# Patient Record
Sex: Female | Born: 1975 | Race: Black or African American | Hispanic: No | Marital: Single | Smoking: Never smoker
Health system: Southern US, Community
[De-identification: ages and names within clinical notes are randomized; demographics above are authoritative.]

## PROBLEM LIST (undated history)

## (undated) HISTORY — PX: ABDOMINAL SURGERY: SHX537

---

## 1998-04-14 ENCOUNTER — Emergency Department (HOSPITAL_COMMUNITY): Admission: EM | Admit: 1998-04-14 | Discharge: 1998-04-14 | Payer: Self-pay | Admitting: Emergency Medicine

## 1998-07-23 ENCOUNTER — Ambulatory Visit (HOSPITAL_COMMUNITY): Admission: RE | Admit: 1998-07-23 | Discharge: 1998-07-23 | Payer: Self-pay | Admitting: Obstetrics & Gynecology

## 1998-12-16 ENCOUNTER — Inpatient Hospital Stay (HOSPITAL_COMMUNITY): Admission: AD | Admit: 1998-12-16 | Discharge: 1998-12-16 | Payer: Self-pay | Admitting: *Deleted

## 1998-12-19 ENCOUNTER — Inpatient Hospital Stay (HOSPITAL_COMMUNITY): Admission: AD | Admit: 1998-12-19 | Discharge: 1998-12-21 | Payer: Self-pay | Admitting: Obstetrics

## 1998-12-19 ENCOUNTER — Observation Stay (HOSPITAL_COMMUNITY): Admission: AD | Admit: 1998-12-19 | Discharge: 1998-12-19 | Payer: Self-pay | Admitting: *Deleted

## 1999-12-17 ENCOUNTER — Emergency Department (HOSPITAL_COMMUNITY): Admission: EM | Admit: 1999-12-17 | Discharge: 1999-12-17 | Payer: Self-pay | Admitting: Emergency Medicine

## 2001-01-31 ENCOUNTER — Inpatient Hospital Stay (HOSPITAL_COMMUNITY): Admission: AD | Admit: 2001-01-31 | Discharge: 2001-01-31 | Payer: Self-pay | Admitting: *Deleted

## 2001-02-06 ENCOUNTER — Other Ambulatory Visit: Admission: RE | Admit: 2001-02-06 | Discharge: 2001-02-06 | Payer: Self-pay | Admitting: Obstetrics

## 2001-02-16 ENCOUNTER — Inpatient Hospital Stay (HOSPITAL_COMMUNITY): Admission: AD | Admit: 2001-02-16 | Discharge: 2001-02-16 | Payer: Self-pay | Admitting: Obstetrics & Gynecology

## 2001-04-19 ENCOUNTER — Ambulatory Visit (HOSPITAL_COMMUNITY): Admission: RE | Admit: 2001-04-19 | Discharge: 2001-04-19 | Payer: Self-pay | Admitting: *Deleted

## 2001-04-26 ENCOUNTER — Emergency Department (HOSPITAL_COMMUNITY): Admission: EM | Admit: 2001-04-26 | Discharge: 2001-04-26 | Payer: Self-pay | Admitting: Emergency Medicine

## 2001-04-26 ENCOUNTER — Encounter: Payer: Self-pay | Admitting: Emergency Medicine

## 2001-06-26 ENCOUNTER — Inpatient Hospital Stay (HOSPITAL_COMMUNITY): Admission: AD | Admit: 2001-06-26 | Discharge: 2001-06-26 | Payer: Self-pay | Admitting: Obstetrics

## 2001-07-17 ENCOUNTER — Ambulatory Visit (HOSPITAL_COMMUNITY): Admission: RE | Admit: 2001-07-17 | Discharge: 2001-07-17 | Payer: Self-pay | Admitting: *Deleted

## 2001-09-01 ENCOUNTER — Encounter (HOSPITAL_COMMUNITY): Admission: RE | Admit: 2001-09-01 | Discharge: 2001-09-01 | Payer: Self-pay | Admitting: *Deleted

## 2001-09-03 ENCOUNTER — Inpatient Hospital Stay (HOSPITAL_COMMUNITY): Admission: AD | Admit: 2001-09-03 | Discharge: 2001-09-06 | Payer: Self-pay | Admitting: *Deleted

## 2001-09-03 ENCOUNTER — Encounter (INDEPENDENT_AMBULATORY_CARE_PROVIDER_SITE_OTHER): Payer: Self-pay | Admitting: Specialist

## 2001-09-08 ENCOUNTER — Inpatient Hospital Stay (HOSPITAL_COMMUNITY): Admission: AD | Admit: 2001-09-08 | Discharge: 2001-09-12 | Payer: Self-pay | Admitting: Obstetrics & Gynecology

## 2001-10-26 ENCOUNTER — Encounter: Admission: RE | Admit: 2001-10-26 | Discharge: 2001-11-10 | Payer: Self-pay | Admitting: Neurology

## 2002-05-30 ENCOUNTER — Emergency Department (HOSPITAL_COMMUNITY): Admission: EM | Admit: 2002-05-30 | Discharge: 2002-05-30 | Payer: Self-pay | Admitting: Emergency Medicine

## 2003-03-30 ENCOUNTER — Emergency Department (HOSPITAL_COMMUNITY): Admission: EM | Admit: 2003-03-30 | Discharge: 2003-03-30 | Payer: Self-pay | Admitting: Emergency Medicine

## 2005-03-18 ENCOUNTER — Emergency Department (HOSPITAL_COMMUNITY): Admission: EM | Admit: 2005-03-18 | Discharge: 2005-03-18 | Payer: Self-pay | Admitting: Emergency Medicine

## 2005-07-21 ENCOUNTER — Emergency Department (HOSPITAL_COMMUNITY): Admission: EM | Admit: 2005-07-21 | Discharge: 2005-07-21 | Payer: Self-pay | Admitting: Emergency Medicine

## 2007-03-08 ENCOUNTER — Encounter: Admission: RE | Admit: 2007-03-08 | Discharge: 2007-03-08 | Payer: Self-pay | Admitting: Internal Medicine

## 2007-04-14 ENCOUNTER — Encounter: Admission: RE | Admit: 2007-04-14 | Discharge: 2007-05-17 | Payer: Self-pay | Admitting: Internal Medicine

## 2007-11-20 ENCOUNTER — Encounter: Admission: RE | Admit: 2007-11-20 | Discharge: 2007-11-20 | Payer: Self-pay | Admitting: Occupational Medicine

## 2008-01-11 ENCOUNTER — Inpatient Hospital Stay (HOSPITAL_COMMUNITY): Admission: AD | Admit: 2008-01-11 | Discharge: 2008-01-13 | Payer: Self-pay | Admitting: Obstetrics & Gynecology

## 2008-02-11 ENCOUNTER — Inpatient Hospital Stay (HOSPITAL_COMMUNITY): Admission: AD | Admit: 2008-02-11 | Discharge: 2008-02-11 | Payer: Self-pay | Admitting: Obstetrics

## 2008-03-23 ENCOUNTER — Inpatient Hospital Stay (HOSPITAL_COMMUNITY): Admission: AD | Admit: 2008-03-23 | Discharge: 2008-03-23 | Payer: Self-pay | Admitting: Obstetrics & Gynecology

## 2008-03-28 ENCOUNTER — Encounter (INDEPENDENT_AMBULATORY_CARE_PROVIDER_SITE_OTHER): Payer: Self-pay | Admitting: Obstetrics & Gynecology

## 2008-03-28 ENCOUNTER — Inpatient Hospital Stay (HOSPITAL_COMMUNITY): Admission: AD | Admit: 2008-03-28 | Discharge: 2008-03-30 | Payer: Self-pay | Admitting: Obstetrics and Gynecology

## 2009-09-01 ENCOUNTER — Emergency Department (HOSPITAL_COMMUNITY): Admission: EM | Admit: 2009-09-01 | Discharge: 2009-09-01 | Payer: Self-pay | Admitting: Emergency Medicine

## 2011-02-23 ENCOUNTER — Other Ambulatory Visit: Payer: Self-pay | Admitting: Obstetrics

## 2011-05-04 NOTE — Op Note (Signed)
[name], Allison Klein              ACCOUNT NO.:  [id number]   MEDICAL RECORD NO.:  [id number]          PATIENT TYPE:  INP   LOCATION:  9120                          FACILITY:  [facility]   PHYSICIAN:  Genia Del, M.D.DATE OF BIRTH:  11-Jul-1976   DATE OF PROCEDURE:  03/28/2008  DATE OF DISCHARGE:                               OPERATIVE REPORT   PREOPERATIVE DIAGNOSIS:  Desire for postpartum bilateral tubal  sterilization.   POSTOPERATIVE DIAGNOSIS:  Desire for postpartum bilateral tubal  sterilization.   INTERVENTION:  Postpartum bilateral tubal sterilization by modified  Pomeroy technique by mini-laparotomy   SURGEON:  Genia Del, M.D.   ANESTHESIOLOGIST:  Raul Del, M.D.   PROCEDURE:  Under spinal anesthesia, the patient is in dorsal decubitus  position, she is prepped with Betadine on the abdominal and suprapubic  areas and draped as usual.  We make an infraumbilical incision over 2 cm  with the scalpel.  We open the aponeurosis transversely with Mayo  scissors and open the parietoperitoneum bluntly with a finger.  We then  use Navy retractors to reach the left tube and the left tube is grasped  with two Babcocks, the distal end of the tube is identified with the  fimbriae.  We then make a window in the mesosalpinx with the  electrocautery.  We use plain suture to ligate the tube proximally at  about 2 cm from the cornua and then distally about 1 cm from the first  suture.  We then cut with Metzenbaum scissors a portion of the tube  between the two sutures and send it to pathology.  We cauterize each cut  extremity of the tube.  Hemostasis is adequate.  We proceed exactly the  same way on the right tube and hemostasis is adequate at all levels.  We  remove the retractors. We closed the aponeurosis with a 0 Vicryl running  suture.  We then complete hemostasis on the adipose tissue with the  electrocautery and close the skin with a subcuticular stitch of 4-0  Vicryl.  Dermabond is then applied on the skin.  The count of  instruments and sponges was complete. The estimated blood loss was  minimal.  No complications occurred.  The patient was brought to the  recovery room in good stable status.      Genia Del, M.D.  Electronically Signed     ML/MEDQ  D:  03/28/2008  T:  03/28/2008  Job:  [id number]

## 2011-05-07 NOTE — Discharge Summary (Signed)
[facility] of [facility]  Patient:    Allison Klein, Allison Klein Visit Number: [id number] MRN: [id number]          Service Type: ANT Location: [facility] Attending Physician:  Michaelle Copas Dictated by:   Bradly Bienenstock, M.D. Admit Date:  09/01/2001 Discharge Date: 09/01/2001                             Discharge Summary  DISCHARGE DIAGNOSES:          1. Intrauterine pregnancy at                                  40 weeks--delivered.                               2. Status post low transverse cesarean section.                               3. L5 radiculopathy versus right peroneal                                  neuropathy.  CONSULTS WHILE IN HOSPITAL:   Dr. Orlin Hilding, neurology.  PROCEDURES WHILE IN HOSPITAL: Low transverse cesarean section. Estimated blood                               loss 800 cc.  DISCHARGE MEDICATIONS:        1. Motrin 800 mg t.i.d. scheduled for five days                                  then p.r.n.                               2. Percocet one to two tabs q.6h. p.r.n. pain.                               3. Prenatal vitamins.  DISCHARGE FOLLOWUP:           The patient is to follow up with (1) [facility] in six weeks, and (2) with Dr. Orlin Hilding, [facility], if symptoms of her radiculopathy do not improve within three weeks.  HOSPITAL COURSE:              This is a 35 year old, G2, P1-0-0-1, who presented at 40 weeks 2 days who was dated by a last menstrual period and second trimester ultrasound, who presented in active labor without membrane rupture. The patient had a history of one prior normal spontaneous vaginal delivery of a 7-pound 1-ounce female and had not had any complications. The patient did, however, have some preterm labor with this pregnancy which was believed secondary to bacterial vaginosis. At any rate, she presented in active labor and was admitted for management of her labor. The patient progressed in labor until  the evening of September 15 when the patient did not have any change in station or dilation. The patient was felt to be +2,  vertex, with complete dilation but thought to be in OP position and without further descent from +2 station. Therefore, it was decided to take the patient back for low transverse cesarean section which was performed by Dr. Clearance Coots under epidural anesthesia. For full details of that surgery, please see that dictation. The patient was delivered of a viable female, Apgars 8 and 9 and weighed 10 pounds 5 ounces. The patients anatomy appeared normal during the surgery.  The patient began routine postpartum care, however, on two days postpartum, she began complaining of right leg paresthesias and was found to have decreased strength in her right lower extremity, particularly with dorsiflexion. Secondary to this, neurology was consulted. They felt that she either had an L5 radiculopathy or a right peroneal neuropathy, but felt that she was okay to be discharged home and likely this would resolve on its own. However, if not, she was to follow with neurology in three weeks.  Hospital course was otherwise uncomplicated.  DISCHARGE LABORATORIES:       The patients discharge hemoglobin was 10.5.Dictated by:   Bradly Bienenstock, M.D. Attending Physician:  Michaelle Copas DD:  09/06/01 TD:  09/06/01 Job: [id number]

## 2011-05-07 NOTE — Consult Note (Signed)
[facility] of [facility]  Patient:    Allison Klein, Allison Klein Visit Number: [id number] MRN: [id number]          Service Type: ANT Location: [facility] Attending Physician:  Michaelle Copas Dictated by:   Gustavus Messing Orlin Hilding, M.D. Consultation Date: 09/07/19 Admit Date:  09/01/2001 Discharge Date: 09/01/2001                            Consultation Report  NEUROLOGY CONSULTATION  DATE OF ADMISSION:            09/04/2001.  CHIEF COMPLAINT:              Right leg numbness, weakness, and pain.  HISTORY OF PRESENT ILLNESS:   Allison Klein is a 35 year old woman, status post a C-section on September 15 for failure to descend. On recovering from the epidural, she complained of her right leg continuing to be weak and numb and numbness in the medial aspect from the knee down with mild discomfort and a foot drop. There is mild discomfort present.  REVIEW OF SYSTEMS:            Positive for some mild incisional pain preoperatively and prepregnancy, she had no right leg pain or numbness. She had had some posterior buttock sciatica-type pain at the end of her pregnancy.  PAST MEDICAL HISTORY:         Significant for obesity, remote Chlamydia, previous childbirth, but otherwise healthy.  MEDICATIONS:                  She is just on a lot of p.r.n. medications which include Peri-Colace, Mylicon, Tylenol, Percocet, Ibuprofen, Darvocet, morphine, Dilaudid, Phenergan, Benadryl and Ambien.  ALLERGIES:                    No known drug allergies.  SOCIAL HISTORY:               No cigarette and alcohol use.  FAMILY HISTORY:               Noncontributory.  PHYSICAL EXAMINATION:  VITAL SIGNS:                  Temperature is 97.9, pulse 90, respirations 20, blood pressure 118/54.  NEUROLOGIC/EXTREMITIES:       She has good grips bilaterally. Normal cranial nerves, upper extremities are unaffected. In lower extremities she has mild to moderate edema, right greater than left.  The left lower extremity shows normal 5/5 strength. Right lower extremity: She has got 5/5 psoas, 5-/5 quads, 5-/5 hamstring, 0-1/5 foot dorsiflexion with good toe flexion, 0-1/5 extensor hallucis longus, 3-4/5 inversion, 4/5 eversion. She has very brisk reflexes symmetrically in knee jerks and ankles, downgoing toes.  Subjectively, she has numbness medially below the knee. She ambulates with a mild right foot drop.  IMPRESSION:                   Right L5 radiculopathy versus right peroneal neuropathy. Some mixed findings on exam make it a little bit difficult to sort out.  RECOMMENDATION:               Create a discharge according to your plans. She may benefit from an ankle/foot orthotic. She should call our office at [phone] if her symptoms are not resolved in three weeks. We can then set up nerve conduction EMG to confirm neuropathy versus radiculopathy and give prognosis. Dictated by:  Catherine A. Orlin Hilding, M.D. Attending Physician:  Michaelle Copas DD:  09/06/01 TD:  09/06/01 Job: [phone]

## 2011-05-07 NOTE — Discharge Summary (Signed)
[facility] of [facility]  Patient:    Allison Klein, Allison Klein Visit Number: [id number] MRN: [id number]          Service Type: MED Location: 910A 9148 01 Attending Physician:  Antionette Char Dictated by:   Salvadore Dom, M.D. Admit Date:  09/08/2001 Discharge Date: 09/12/2001                             Discharge Summary  HISTORY OF PRESENT ILLNESS:   A 35 year old, postpartum day 5, from low C-section for failure descend and persistent OP for previous discharge.  She had a neuropathy noted at last hospitalization.  She was seen in [facility]U for follow-up for blood pressure check and staple removal.  While there, she fell, with resultant left sided pain.  Patient was also noted to have increased blood pressures with increased uric acid and LFTs.  Admitted for magnesium sulfate treatment of preeclampsia.  HOSPITAL COURSE:              She complained during her hospitalization of worsening frontal headache, but also had some nasal congestion.  She initially had some right upper quadrant tenderness, but this improved on hospital day #1.  Her magnesium was initially increased on day 1, secondary to being subtherapeutic.  Her LFTs resolved rapidly and her headache decreased.  On hospital day #3, her LFTs were in the normal range.  She was headache-free. Her uric acid and LDH continued to be mildly elevated, but were trending down. She was then discharged home with routine follow up.  DISCHARGE DIET:               Regular.  DISCHARGE ACTIVITY:           No heavy lifting for two weeks.  DISCHARGE FOLLOW-UP:          She is to be seen in maternity admissions two days post discharge for blood pressure check and then at her routine postpartum check.  DISCHARGE MEDICATIONS:        1. Motrin 800 mg p.o. t.i.d.                               2. Percocet as needed every four hours for pain.  DISCHARGE DIAGNOSES:          1. Mild preeclampsia.                               2.  Acute upper respiratory infection, resolved.                               3. Status post fall with resultant left sided                               pain, resolved. Dictated by:   Salvadore Dom, M.D. Attending Physician:  Antionette Char DD:  12/11/01 TD:  12/13/01 Job: [id number]

## 2011-05-07 NOTE — Op Note (Signed)
[facility] of [facility]  Patient:    Allison Klein, Allison Klein Visit Number: [id number] MRN: [id number]          Service Type: OBS Location: 910B 9164 01 Attending Physician:  Michaelle Copas Dictated by:   Bing Neighbors Clearance Coots, M.D. Proc. Date: 09/03/01 Admit Date:  09/03/2001                             Operative Report  PREOPERATIVE DIAGNOSIS:       Arrest of descent.  POSTOPERATIVE DIAGNOSIS:      Arrest of descent.  PROCEDURE:                    Primary low transverse cesarean section.  SURGEON:                      Charles A. Clearance Coots, M.D.  ASSISTANT:                    Marlinda Mike, C.N.M.  ANESTHESIA:                   Epidural.  COMPLICATIONS:                None.  DRAINS:                       Foley to gravity.  ESTIMATED BLOOD LOSS:         800 ml.  IV FLUIDS:                    2200 ml.  URINE OUT:                    200 ml clear.  FINDINGS:                     Viable female at 55.  Apgars 8 at one minute and 9 at five minutes.  Weight 10 lb 5 oz.  Normal uterus fallopian tubes and ovaries.  DESCRIPTION OF PROCEDURE:     The patient was brought to the operating room and, after satisfactory redosing of the epidural to a satisfactory level, the abdomen was prepped and draped in the usual sterile fashion.  A Pfannenstiel skin incision was made with a scalpel and was deepened down to the fascia with the scalpel.  The fascia was nicked in the midline.  The fascial incision was extended to the left and to the right with curved Mayo scissors.  The superior and inferior  fascial edges were taken off of the rectus muscle with both blunt and sharp dissection.  The rectus muscles were bluntly divided in the midline and the peritoneum was entered digitally and was digitally extended to the left and to the right.  A bladder blade was positioned and the vesicouterine fold of the peritoneum above the reflection of the urinary bladder was grasped with  forceps, was incision and undermined with Metzenbaum scissors.  The incision was extended to the left and to the right with Metzenbaum scissors.  A bladder flap was bluntly developed and the bladder blade was repositioned in front of the urinary bladder and placed well out of the operative field.  The uterus was entered in the lower uterine segment transversely with a scalpel.  A moderate amount of clear amniotic fluid was expelled.  The uterine incision was extended to the  left and to the right in a smile configuration with the bandage scissors.  The vertex was noted to be in the left occiput posterior position.  The occiput was rotated into the incision.  The delivery was accomplished with the aid of fundal pressure from the assistant.  The infants mouth and nose were suctioned with the suction bulb and delivery was completed with the aid of fundal pressure from the assistant.  The umbilical cord was doubly clamped and cut and the infant was handed to the nursery staff.  Cord blood was obtained and the placenta was spontaneously expelled from the uterine cavity intact.  The uterus was exteriorized and the endometrial surfaces thoroughly debrided with a dry lap sponge.  The edges of the uterine incision were grasped with ring forceps. The uterus was closed with continuous suture of interlocking suture of 0 Monocryl from each corner to the center.  Hemostasis was excellent.  The uterus was placed back in its normal anatomic position and the pelvic cavity was thoroughly irrigated with warm saline solution.  All clots were removed. Closure of the uterus was again observed for hemostasis, and there was no active bleeding noted.  The abdomen was then closed as follows.  The fascia was closed with a continuous suture of 0 PDS from each corner to the center. The subcutaneous tissue was thoroughly irrigated with warm saline solution. All areas of subcutaneous bleeding were coagulated with the  Bovie.  The skin was then approximated with surgical stainless steel staples.  Sterile bandage was applied to the incision closure.  The surgical technician indicated that all sponge, needle and instrument counts were correct.  The patient tolerated the procedure well and was transported to the recovery room in satisfactory condition. Dictated by:   Bing Neighbors Clearance Coots, M.D. Attending Physician:  Michaelle Copas DD:  09/03/01 TD:  09/03/01 Job: [id number]

## 2011-09-09 LAB — CBC
HCT: 35.2 — ABNORMAL LOW
Hemoglobin: 11.9 — ABNORMAL LOW
MCHC: 33.7
MCV: 89
RBC: 3.96

## 2011-09-09 LAB — URINE CULTURE
Colony Count: 60000
Special Requests: NEGATIVE

## 2011-09-10 LAB — URINALYSIS, ROUTINE W REFLEX MICROSCOPIC
Glucose, UA: NEGATIVE
Hgb urine dipstick: NEGATIVE
Ketones, ur: NEGATIVE
Protein, ur: NEGATIVE
pH: 6

## 2011-09-10 LAB — CBC
Platelets: 240
WBC: 12.2 — ABNORMAL HIGH

## 2011-09-10 LAB — COMPREHENSIVE METABOLIC PANEL
AST: 23
Albumin: 2.7 — ABNORMAL LOW
Alkaline Phosphatase: 75
Chloride: 101
GFR calc Af Amer: >60
Potassium: 3.3 — ABNORMAL LOW
Total Bilirubin: 0.5
Total Protein: 6.4

## 2011-09-14 LAB — CBC
HCT: 28.1 — ABNORMAL LOW
HCT: 38.9
Hemoglobin: 13.3
Hemoglobin: 9.7 — ABNORMAL LOW
MCHC: 34.1
MCHC: 34.6
MCV: 88.7
MCV: 88.7
Platelets: 209
RBC: 4.38
RDW: 13.7
WBC: 13.6 — ABNORMAL HIGH

## 2011-09-14 LAB — URINE MICROSCOPIC-ADD ON

## 2011-09-14 LAB — URINALYSIS, ROUTINE W REFLEX MICROSCOPIC
Glucose, UA: NEGATIVE
Ketones, ur: NEGATIVE
Specific Gravity, Urine: <1.005 — ABNORMAL LOW
pH: 7

## 2011-09-14 LAB — URINE CULTURE: Colony Count: 50000

## 2012-05-02 ENCOUNTER — Other Ambulatory Visit: Payer: Self-pay | Admitting: Otolaryngology

## 2012-05-02 DIAGNOSIS — J323 Chronic sphenoidal sinusitis: Secondary | ICD-10-CM

## 2012-05-04 ENCOUNTER — Other Ambulatory Visit: Payer: Self-pay

## 2016-05-31 ENCOUNTER — Encounter: Payer: Self-pay | Admitting: Emergency Medicine

## 2016-05-31 ENCOUNTER — Emergency Department
Admission: EM | Admit: 2016-05-31 | Discharge: 2016-05-31 | Disposition: A | Discharge status: Home / Self Care | Payer: Self-pay | Attending: Emergency Medicine | Admitting: Emergency Medicine

## 2016-05-31 ENCOUNTER — Emergency Department: Payer: Self-pay

## 2016-05-31 DIAGNOSIS — G8929 Other chronic pain: Secondary | ICD-10-CM | POA: Insufficient documentation

## 2016-05-31 DIAGNOSIS — R51 Headache: Secondary | ICD-10-CM | POA: Insufficient documentation

## 2016-05-31 MED ORDER — IBUPROFEN 600 MG PO TABS
600.0000 mg | ORAL_TABLET | Freq: Once | ORAL | Status: AC
  Administered 2016-05-31: 600 mg via ORAL
  Filled 2016-05-31: qty 1

## 2016-05-31 MED ORDER — TRAMADOL HCL 50 MG PO TABS: 50.0000 mg | ORAL_TABLET | Freq: Four times a day (QID) | ORAL | Status: DC | PRN

## 2016-05-31 NOTE — ED Provider Notes (Signed)
Time Seen: Approximately 1330 I have reviewed the triage notes  Chief Complaint: Headache and Blurred Vision   History of Present Illness: Allison Klein is a 40 y.o. female who was sent over from the local medical clinic for evaluation of headaches that have been occurring now for the last several months. She states the headaches without to be sinus issues and she's been taking over-the-counter Mucinex, etc. She states now that she's not getting any relief from these over-the-counter meds. She states she's been previously diagnosed with cluster headaches but the medication and was prescribed was "" too powerful "" and she wished not to be taking it anymore. She denies any fever or significant nasal discharge or drainage at this point and states her headaches mainly located in the frontal region. She denies any nausea or vomiting. There was concern because she had stated that she had some blurred vision. She denies any focal visual deficits at this time. She denies any eye pain.  History reviewed. No pertinent past medical history. Headaches There are no active problems to display for this patient.   Past Surgical History  Procedure Laterality Date  . Abdominal surgery      Past Surgical History  Procedure Laterality Date  . Abdominal surgery      Current Outpatient Rx  Name  Route  Sig  Dispense  Refill  . traMADol (ULTRAM) 50 MG tablet   Oral   Take 1 tablet (50 mg total) by mouth every 6 (six) hours as needed.   20 tablet   0     Allergies:  Review of patient's allergies indicates no known allergies.  Family History: No family history on file.  Social History: Social History  Substance Use Topics  . Smoking status: Never Smoker   . Smokeless tobacco: None  . Alcohol Use: No     Review of Systems:   10 point review of systems was performed and was otherwise negative:  Constitutional: No fever Eyes: Blurred vision without any photophobia or visual field  deficits ENT: No sore throat, ear pain Cardiac: No chest pain Respiratory: No shortness of breath, wheezing, or stridor Abdomen: No abdominal pain, no vomiting, No diarrhea Endocrine: No weight loss, No night sweats Extremities: No peripheral edema, cyanosis Skin: No rashes, easy bruising Neurologic: No focal weakness, trouble with speech or swollowing Urologic: No dysuria, Hematuria, or urinary frequency   Physical Exam:  ED Triage Vitals  Enc Vitals Group     BP 05/31/16 1036 137/83 mmHg     Pulse Rate 05/31/16 1036 94     Resp 05/31/16 1036 18     Temp 05/31/16 1036 98.9 F (37.2 C)     Temp Source 05/31/16 1036 Oral     SpO2 05/31/16 1036 100 %     Weight 05/31/16 1038 180 lb (81.647 kg)     Height 05/31/16 1038  (1.6 m)     Head Cir --      Peak Flow --      Pain Score 05/31/16 1037 8     Pain Loc --      Pain Edu? --      Excl. in GC? --     General: Awake , Alert , and Oriented times 3; GCS 15 Head: Normal cephalic , atraumatic there does not appear to be any significant reproducible component to her palpation. Eyes: Pupils equal , round, reactive to light. No photophobia. Extraocular eye movements are intact Nose/Throat: No nasal drainage,  patent upper airway without erythema or exudate.  Neck: Supple, Full range of motion, No anterior adenopathy or palpable thyroid masses, no meningeal signs Lungs: Clear to ascultation without wheezes , rhonchi, or rales Heart: Regular rate, regular rhythm without murmurs , gallops , or rubs Abdomen: Soft, non tender without rebound, guarding , or rigidity; bowel sounds positive and symmetric in all 4 quadrants. No organomegaly .        Extremities: 2 plus symmetric pulses. No edema, clubbing or cyanosis Neurologic: normal ambulation, Motor symmetric without deficits, sensory intact Skin: warm, dry, no rashes   Radiology: * CT Head Wo Contrast (Final result) Result time: 05/31/16 14:34:52   Final result by Rad Results In  Interface (05/31/16 14:34:52)   Narrative:   CLINICAL DATA: Headache and blurred vision for 6 months, intermittent  EXAM: CT HEAD WITHOUT CONTRAST  TECHNIQUE: Contiguous axial images were obtained from the base of the skull through the vertex without intravenous contrast.  COMPARISON: March 08, 2007  FINDINGS: The ventricles are normal in size and configuration. There is no intracranial mass, hemorrhage, extra-axial fluid collection, or midline shift. The gray-white compartments are normal. No acute infarct evident. The bony calvarium appears intact. The mastoid air cells are clear. Visualized orbits are symmetric.  IMPRESSION: Study within normal limits.   Electronically Signed By: Bretta BangWilliam Woodruff III M.D. On: 05/31/2016 14:34            I personally reviewed the radiologic studies     ED Course:  Patient's stay here was uneventful and an evaluation of the headaches that does not appear to be a acute issue. Patient denies any fever, meningeal signs, and does not appear to have any focal neurologic deficits etc. I was a sure of the source of the patient's headaches at this time but it may be migraine equivalent versus cluster, etc. Advised her it didn't sound north. To be of a sinus headache and I would stop the over-the-counter medication at this time other than Tylenol and/or ibuprofen for pain. Forearm evaluate for masses, clinically I felt this was unlikely to be a subarachnoid hemorrhage or cavernous sinus venous thrombosis, etc.    Assessment: Acute unspecified cephalgia  Final Clinical Impression:   Final diagnoses:  Chronic nonintractable headache, unspecified headache type     Plan:  Outpatient management Patient was advised to return immediately if condition worsens. Patient was advised to follow up with their primary care physician or other specialized physicians involved in their outpatient care. The patient and/or family member/power  of attorney had laboratory results reviewed at the bedside. All questions and concerns were addressed and appropriate discharge instructions were distributed by the nursing staff.             Jennye MoccasinBrian S Quigley, MD 05/31/16 [id number]

## 2016-05-31 NOTE — Discharge Instructions (Signed)
General Headache Without Cause °A headache is pain or discomfort felt around the head or neck area. There are many causes and types of headaches. In some cases, the cause may not be found.  °HOME CARE  °Managing Pain °· Take over-the-counter and prescription medicines only as told by your doctor. °· Lie down in a dark, quiet room when you have a headache. °· If directed, apply ice to the head and neck area: °¨ Put ice in a plastic bag. °¨ Place a towel between your skin and the bag. °¨ Leave the ice on for 20 minutes, 2-3 times per day. °· Use a heating pad or hot shower to apply heat to the head and neck area as told by your doctor. °· Keep lights dim if bright lights bother you or make your headaches worse. °Eating and Drinking °· Eat meals on a regular schedule. °· Lessen how much alcohol you drink. °· Lessen how much caffeine you drink, or stop drinking caffeine. °General Instructions °· Keep all follow-up visits as told by your doctor. This is important. °· Keep a journal to find out if certain things bring on headaches. For example, write down: °¨ What you eat and drink. °¨ How much sleep you get. °¨ Any change to your diet or medicines. °· Relax by getting a massage or doing other relaxing activities. °· Lessen stress. °· Sit up straight. Do not tighten (tense) your muscles. °· Do not use tobacco products. This includes cigarettes, chewing tobacco, or e-cigarettes. If you need help quitting, ask your doctor. °· Exercise regularly as told by your doctor. °· Get enough sleep. This often means 7-9 hours of sleep. °GET HELP IF: °· Your symptoms are not helped by medicine. °· You have a headache that feels different than the other headaches. °· You feel sick to your stomach (nauseous) or you throw up (vomit). °· You have a fever. °GET HELP RIGHT AWAY IF:  °· Your headache becomes really bad. °· You keep throwing up. °· You have a stiff neck. °· You have trouble seeing. °· You have trouble speaking. °· You have  pain in the eye or ear. °· Your muscles are weak or you lose muscle control. °· You lose your balance or have trouble walking. °· You feel like you will pass out (faint) or you pass out. °· You have confusion. °  °This information is not intended to replace advice given to you by your health care provider. Make sure you discuss any questions you have with your health care provider. °  °Document Released: 09/14/2008 Document Revised: 08/27/2015 Document Reviewed: 03/31/2015 °Elsevier Interactive Patient Education ©2016 [employer] ° °Please return immediately if condition worsens. Please contact her primary physician or the physician you were given for referral. If you have any specialist physicians involved in her treatment and plan please also contact them. Thank you for using [facility]. ° °
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
## 2017-04-19 ENCOUNTER — Ambulatory Visit (INDEPENDENT_AMBULATORY_CARE_PROVIDER_SITE_OTHER): Payer: Self-pay | Admitting: Nurse Practitioner

## 2017-04-19 VITALS — BP 112/70 | HR 99 | Temp 98.3°F | Wt 193.2 lb

## 2017-04-19 DIAGNOSIS — B9689 Other specified bacterial agents as the cause of diseases classified elsewhere: Secondary | ICD-10-CM

## 2017-04-19 DIAGNOSIS — J019 Acute sinusitis, unspecified: Secondary | ICD-10-CM

## 2017-04-19 MED ORDER — MONTELUKAST SODIUM 10 MG PO TABS: 10.0000 mg | ORAL_TABLET | Freq: Every day | ORAL | 3 refills | Status: AC

## 2017-04-19 MED ORDER — FLUCONAZOLE 150 MG PO TABS: 150.0000 mg | ORAL_TABLET | Freq: Once | ORAL | 2 refills | Status: AC

## 2017-04-19 MED ORDER — FLUTICASONE PROPIONATE 50 MCG/ACT NA SUSP: 2.0000 | Freq: Every day | NASAL | 0 refills | Status: AC

## 2017-04-19 MED ORDER — AMOXICILLIN-POT CLAVULANATE 875-125 MG PO TABS: 1.0000 | ORAL_TABLET | Freq: Two times a day (BID) | ORAL | 0 refills | Status: DC

## 2017-04-19 NOTE — Progress Notes (Signed)
Subjective:  Allison Klein is a 41 y.o. female who presents for evaluation of possible sinusitis.  Symptoms include achiness, congestion, facial pain, headache described as frontal and describes pain as sharp and aching, rates 6/10, post nasal drip, sinus pressure, sinus pain and tooth pain.  Onset of symptoms was several months ago, and has been gradually worsening since that time.  Treatment to date:  Mucinex, Sudafed, Sinex, Tylenol Sinus, Advil Sinus and Claritin.  High risk factors for influenza complications:  none.  The following portions of the patient's history were reviewed and updated as appropriate:  allergies, current medications and past medical history.  Constitutional: positive for chills and fevers, negative for night sweats and sweats Eyes: positive for contacts/glasses, irritation, redness and visual disturbance, negative for icterus Ears, nose, mouth, throat, and face: positive for earaches, nasal congestion and sore throat, negative for ear drainage and voice change Respiratory: positive for cough and history of bronchitis as a child. + mucous production, yellow- clear Gastrointestinal: positive for nausea Neurological: positive for headaches Allergic/Immunologic: positive for hay fever Objective:  BP 112/70   Pulse 99   Temp 98.3 F (36.8 C)   Wt 193 lb 3.2 oz (87.6 kg)   SpO2 99%   BMI 34.22 kg/m  General appearance: alert and cooperative Head: Normocephalic, without obvious abnormality, atraumatic, sinuses tender to percussion Eyes: conjunctivae/corneas clear. PERRL, EOM's intact. Fundi benign. Ears: normal TM's and external ear canals both ears Nose: turbinates red, swollen, sinus tenderness bilateral, moderate maxillary sinus tenderness bilateral, moderate frontal sinus tenderness bilateral Throat: lips, mucosa, and tongue normal; teeth and gums normal Lungs: clear to auscultation bilaterally Heart: regular rate and rhythm, S1, S2 normal, no murmur, click, rub  or gallop Abdomen: soft, non-tender; bowel sounds normal; no masses,  no organomegaly Neurologic: Grossly normal    Assessment:  sinusitis    Plan:  Discussed diagnosis and treatment of sinusitis. Suggested symptomatic OTC remedies. Nasal saline spray for congestion. Augmentin per orders. Nasal steroids per orders. Avoid triggers such as pollen, pet dander, cigarette smoke.  Increase fluids.  Tylenol or Ibuprofen for pain, fever or general discomfort.  Use Singulair daily to help control allergy symtpoms.  Follow up as needed.

## 2017-04-19 NOTE — Patient Instructions (Addendum)

## 2017-04-21 ENCOUNTER — Telehealth: Payer: Self-pay | Admitting: Nurse Practitioner

## 2017-04-21 NOTE — Telephone Encounter (Signed)
Called patient to check her status.  Spoke with patient and patient states she is doing better.  Patient states, "I am slowly improving".  Patient will continue medications as prescribed.  Will follow up as needed.

## 2017-04-29 ENCOUNTER — Encounter (HOSPITAL_COMMUNITY): Payer: Self-pay

## 2017-04-29 ENCOUNTER — Emergency Department (HOSPITAL_COMMUNITY): Payer: Self-pay

## 2017-04-29 ENCOUNTER — Emergency Department (HOSPITAL_COMMUNITY)
Admission: EM | Admit: 2017-04-29 | Discharge: 2017-04-30 | Disposition: A | Discharge status: Home / Self Care | Payer: Self-pay | Attending: Emergency Medicine | Admitting: Emergency Medicine

## 2017-04-29 DIAGNOSIS — H4922 Sixth [abducent] nerve palsy, left eye: Secondary | ICD-10-CM | POA: Insufficient documentation

## 2017-04-29 DIAGNOSIS — H532 Diplopia: Secondary | ICD-10-CM | POA: Insufficient documentation

## 2017-04-29 LAB — BASIC METABOLIC PANEL
Anion gap: 7 (ref 5–15)
BUN: 12 mg/dL (ref 6–20)
CO2: 24 mmol/L (ref 22–32)
CREATININE: 0.73 mg/dL (ref 0.44–1.00)
Calcium: 9.2 mg/dL (ref 8.9–10.3)
Chloride: 105 mmol/L (ref 101–111)
GFR calc Af Amer: >60 mL/min (ref >60)
Glucose, Bld: 90 mg/dL (ref 65–99)
Potassium: 3.9 mmol/L (ref 3.5–5.1)
SODIUM: 136 mmol/L (ref 135–145)

## 2017-04-29 LAB — CBC
HCT: 39.7 % (ref 36.0–46.0)
Hemoglobin: 12.8 g/dL (ref 12.0–15.0)
MCH: 28.4 pg (ref 26.0–34.0)
MCHC: 32.2 g/dL (ref 30.0–36.0)
MCV: 88 fL (ref 78.0–100.0)
PLATELETS: 283 10*3/uL (ref 150–400)
RBC: 4.51 MIL/uL (ref 3.87–5.11)
RDW: 12.7 % (ref 11.5–15.5)
WBC: 7.4 10*3/uL (ref 4.0–10.5)

## 2017-04-29 LAB — CBG MONITORING, ED: Glucose-Capillary: 62 mg/dL — ABNORMAL LOW (ref 65–99)

## 2017-04-29 MED ORDER — GADOBENATE DIMEGLUMINE 529 MG/ML IV SOLN
17.0000 mL | Freq: Once | INTRAVENOUS | Status: AC | PRN
  Administered 2017-04-29: 17 mL via INTRAVENOUS

## 2017-04-29 MED ORDER — IOPAMIDOL (ISOVUE-300) INJECTION 61%
INTRAVENOUS | Status: AC
  Administered 2017-04-29: 75 mL
  Filled 2017-04-29: qty 75

## 2017-04-29 NOTE — ED Notes (Signed)
Pt departed in NAD, refused use of wheelchair.  

## 2017-04-29 NOTE — ED Provider Notes (Signed)
[facility]EMER[facility]ENCY DEPT Provider Note   CSN: [id number] Arrival date & time: 04/29/17  1547     History   Chief Complaint Chief Complaint  Patient presents with  . Diplopia    HPI Allison Klein is a 41 y.o. female.  She reports that she's had intermittent double vision over the last couple years. Has not sought care. Reports the last 2 months, she's had increasing frequency of double vision, especially when she looks to the left. She also endorses some congestion and rhinorrhea. Is currently being treated for sinusitis. Over the last 2 days, even more increased frequency of double vision, so presented to an ophthalmologist. Visual acuity and ocular pressures were reported to be normal.  due to worsening of symptoms, sent here for further evaluation.    The history is provided by the patient.  Illness  This is a new problem. Episode onset: intermittently for the last 2 years. The problem occurs constantly. The problem has been gradually worsening. Associated symptoms include headaches. Pertinent negatives include no chest pain, no abdominal pain and no shortness of breath. Exacerbated by: looking to the left. She has tried nothing for the symptoms.    History reviewed. No pertinent past medical history.  There are no active problems to display for this patient.   Past Surgical History:  Procedure Laterality Date  . ABDOMINAL SURGERY      OB History    No data available       Home Medications    Prior to Admission medications   Medication Sig Start Date End Date Taking? Authorizing Provider  fluticasone (FLONASE) 50 MCG/ACT nasal spray Place 2 sprays into both nostrils daily. 04/19/17 04/29/17 Yes Benay PikeLeath, Christie Janell, NP  montelukast (SINGULAIR) 10 MG tablet Take 1 tablet (10 mg total) by mouth at bedtime. 04/19/17 05/19/17 Yes Benay PikeLeath, Christie Janell, NP    Family History History reviewed. No pertinent family history.  Social History Social History  Substance Use  Topics  . Smoking status: Never Smoker  . Smokeless tobacco: Never Used  . Alcohol use No     Allergies   Patient has no known allergies.   Review of Systems Review of Systems  Constitutional: Negative for chills and fever.  HENT: Positive for congestion, rhinorrhea and sinus pressure.   Eyes: Positive for visual disturbance. Negative for pain.  Respiratory: Negative for shortness of breath.   Cardiovascular: Negative for chest pain.  Gastrointestinal: Negative for abdominal pain, diarrhea, nausea and vomiting.  Musculoskeletal: Negative for neck pain and neck stiffness.  Neurological: Positive for headaches. Negative for seizures, syncope, facial asymmetry, speech difficulty, weakness and numbness.  All other systems reviewed and are negative.    Physical Exam Updated Vital Signs BP 116/84   Pulse 64   Temp 98.6 F (37 C) (Oral)   Resp 18   Ht 5' 3.5" (1.613 m)   Wt 87.7 kg   LMP 04/13/2017 (Within Weeks)   SpO2 100%   BMI 33.70 kg/m   Physical Exam  Constitutional: She appears well-developed and well-nourished. No distress.  HENT:  Head: Normocephalic and atraumatic.  Eyes: Conjunctivae are normal. Right eye exhibits normal extraocular motion. Left eye exhibits abnormal extraocular motion (unable to completely abduct the left eye).  Neck: Neck supple.  Cardiovascular: Normal rate and regular rhythm.   No murmur heard. Pulmonary/Chest: Effort normal and breath sounds normal. No respiratory distress.  Abdominal: Soft. There is no tenderness.  Musculoskeletal: She exhibits no edema.  Neurological: She is alert. She  has normal strength. No cranial nerve deficit or sensory deficit. She displays a negative Romberg sign. Coordination normal. GCS eye subscore is 4. GCS verbal subscore is 5. GCS motor subscore is 6.  Double vision when looking to the left.   Skin: Skin is warm and dry.  Psychiatric: She has a normal mood and affect.  Nursing note and vitals  reviewed.    ED Treatments / Results  Labs (all labs ordered are listed, but only abnormal results are displayed) Labs Reviewed  CBG MONITORING, ED - Abnormal; Notable for the following:       Result Value   Glucose-Capillary 62 (*)    All other components within normal limits  BASIC METABOLIC PANEL  CBC    EKG  EKG Interpretation None       Radiology Ct Head W & Wo Contrast  Result Date: 04/29/2017 CLINICAL DATA:  Diplopia EXAM: CT HEAD WITHOUT AND WITH CONTRAST TECHNIQUE: Contiguous axial images were obtained from the base of the skull through the vertex without and with intravenous contrast CONTRAST:  75 mL Isovue-300 COMPARISON:  Head CT 05/31/2016 FINDINGS: Brain: No mass lesion, intraparenchymal hemorrhage or extra-axial collection. No evidence of acute cortical infarct. Brain parenchyma and CSF-containing spaces are normal for age. No abnormal contrast enhancement. Vascular: No hyperdense vessel or unexpected calcification. Skull: Normal visualized skull base, calvarium and extracranial soft tissues. Sinuses/Orbits: No sinus fluid levels or advanced mucosal thickening. No mastoid effusion. Normal orbits. IMPRESSION: Normal head CT. Electronically Signed   By: Deatra Robinson M.D.   On: 04/29/2017 21:59   Mr Laqueta Jean And Wo Contrast  Result Date: 04/29/2017 CLINICAL DATA:  41 y/o F; progressive worsening blurred and double vision for 2 years. Pain over the left eye. EXAM: MRI HEAD WITHOUT AND WITH CONTRAST TECHNIQUE: Multiplanar, multiecho pulse sequences of the brain and surrounding structures were obtained without and with intravenous contrast. CONTRAST:  17mL MULTIHANCE GADOBENATE DIMEGLUMINE 529 MG/ML IV SOLN COMPARISON:  04/29/2017 CT of the head. FINDINGS: Brain: No acute infarction, hemorrhage, hydrocephalus, extra-axial collection or mass lesion. No structural abnormality of the brain. After administration of intravenous contrast there is no abnormal enhancement. Vascular:  Normal flow voids. Skull and upper cervical spine: Normal marrow signal. Sinuses/Orbits: Minimal left sphenoid sinus mucosal thickening. Otherwise no abnormal signal of visualized paranasal sinuses or mastoid air cells. No mass or abnormal enhancement of the orbits is identified. Other: None. IMPRESSION: 1. No acute intracranial abnormality. Unremarkable MRI of the brain. 2. No mass or abnormal enhancement of the orbits is identified. If clinically indicated a dedicated MRI of the orbits would have better sensitivity for orbital pathology. Electronically Signed   By: Mitzi Hansen M.D.   On: 04/29/2017 23:16    Procedures Procedures (including critical care time)  Medications Ordered in ED Medications  iopamidol (ISOVUE-300) 61 % injection (75 mLs  Contrast Given 04/29/17 2148)  gadobenate dimeglumine (MULTIHANCE) injection 17 mL (17 mLs Intravenous Contrast Given 04/29/17 2300)     Initial Impression / Assessment and Plan / ED Course  I have reviewed the triage vital signs and the nursing notes.  Pertinent labs & imaging results that were available during my care of the patient were reviewed by me and considered in my medical decision making (see chart for details).     Exam is consistent with a cranial nerve VI palsy. Differential is long. 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 Possible she has muscle weakness related to that. She also has a history of recurrent sinus infections, and currently has one, being treated for an outpatient. CT here shows no evidence of acute pathology such as cavernous venous thrombosis. CT and MRI are both reassuring for other acute pathology such as mass, stroke, multiple sclerosis.   Multiple other pathologies can cause cranial nerve VI palsy. Spoke with on-call neurology, Dr. Amada Jupiter. Patient continues to be well-appearing here with  normal and stable vital signs. Feel she is appropriate for outpatient follow-up with neurology. Provided her information. Told her to return to the emergency department should there be any concerns.  Final Clinical Impressions(s) / ED Diagnoses   Final diagnoses:  Diplopia  Sixth nerve palsy of left eye    New Prescriptions New Prescriptions   No medications on file     Lindalou Hose, MD 04/30/17 Jorje Guild    Gwyneth Sprout, MD 04/30/17 [phone]

## 2017-04-29 NOTE — ED Notes (Signed)
I connect message sent re: pt wait time

## 2017-04-29 NOTE — ED Notes (Signed)
Patient transported to CT 

## 2017-04-29 NOTE — ED Notes (Signed)
Patient transported to MRI 

## 2017-04-29 NOTE — ED Triage Notes (Signed)
Pt states she was sent by her opthamologist for further examination of double vision. She has had intermittent double vision for years but it has gotten significantly worse in the last 2 days. Pt reports mild pain over the left eye.

## 2018-01-21 IMAGING — CT CT HEAD WO/W CM
3 of 4 series · 16 of 47 positions shown, 19 images · IV contrast (agent unspecified)
Comparison: Head CT 05/31/2016

CLINICAL DATA: Diplopia

EXAM:
CT HEAD WITHOUT AND WITH CONTRAST
TECHNIQUE: Contiguous axial images were obtained from the base of the skull
through the vertex without and with intravenous contrast
CONTRAST:  75 mL 8sovue-566

[Series 3: head wo 5.0 h30s · axial · 0.43mm/px · z∈[+1268,+1388]mm · 10 of 30 slices shown, 13 images]
[im 3/30  brain]
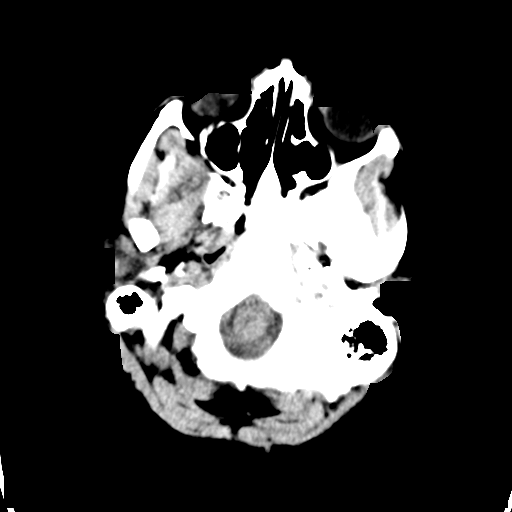
[im 3/30  bone]
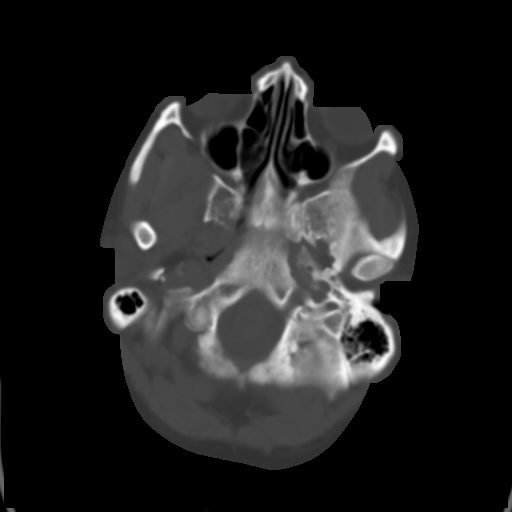
[im 5/30  brain]
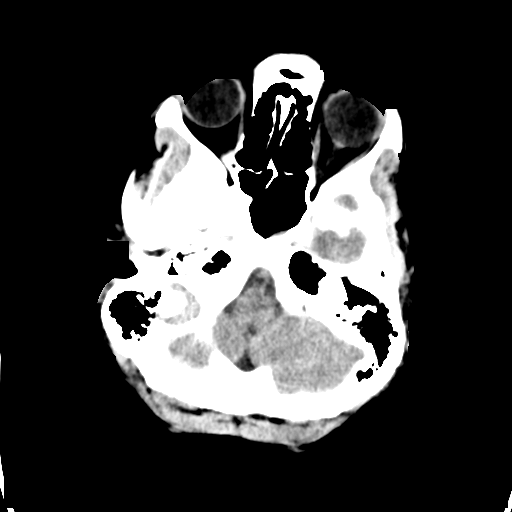
[im 9/30  brain]
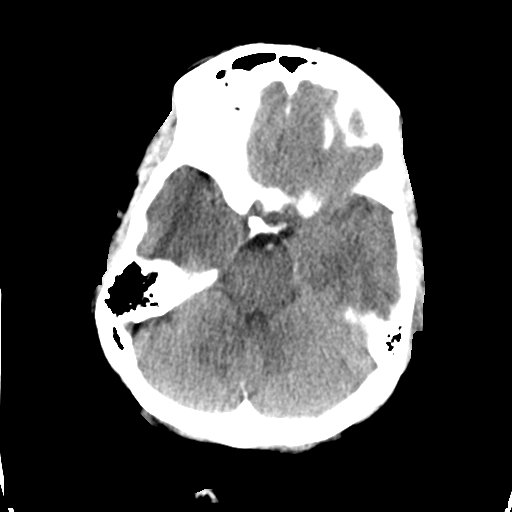
[im 11/30  brain]
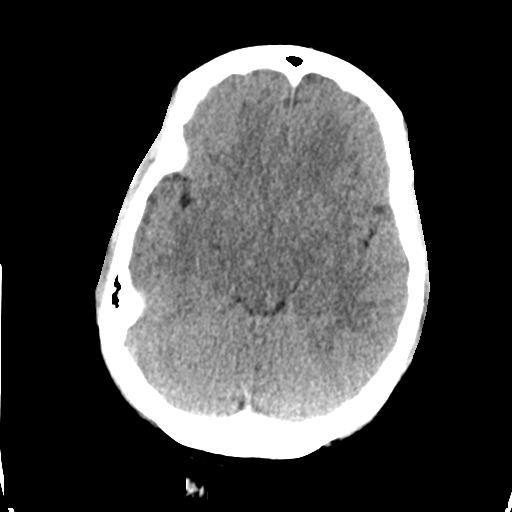
[im 13/30  brain]
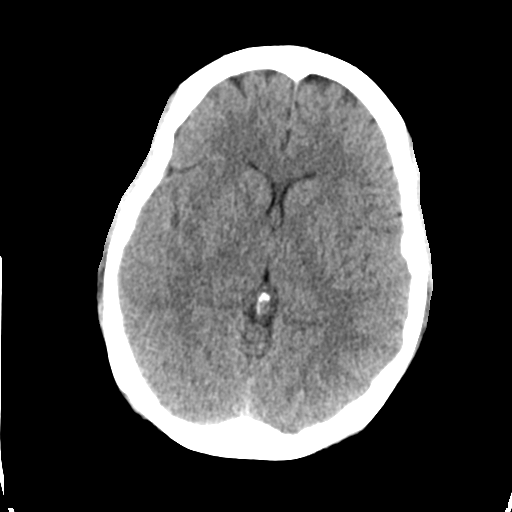
[im 13/30  bone]
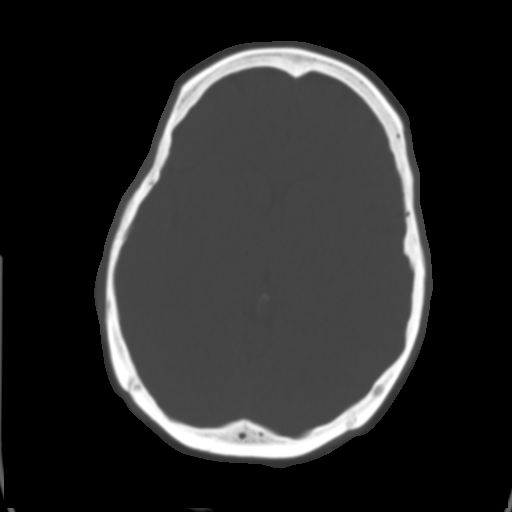
[im 17/30  brain]
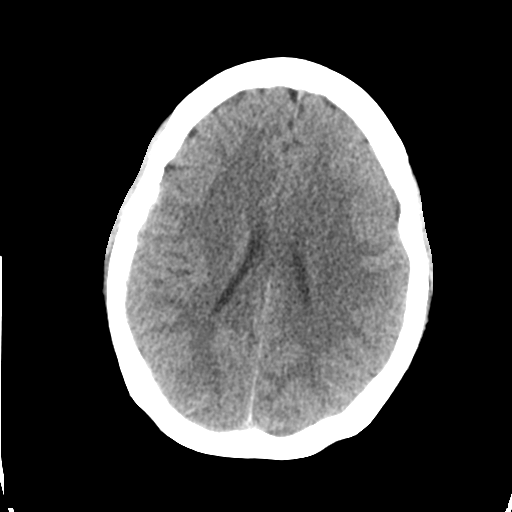
[im 19/30  brain]
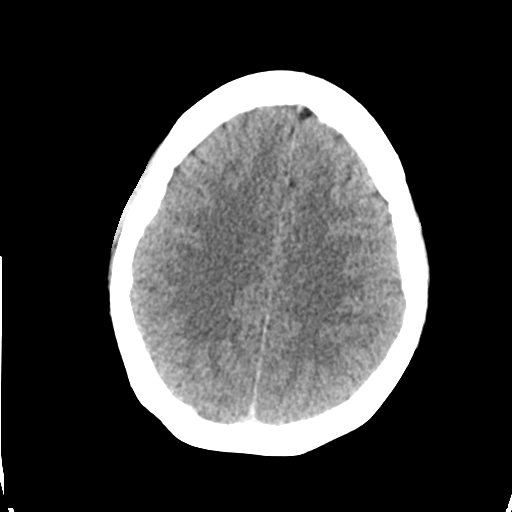
[im 21/30  brain]
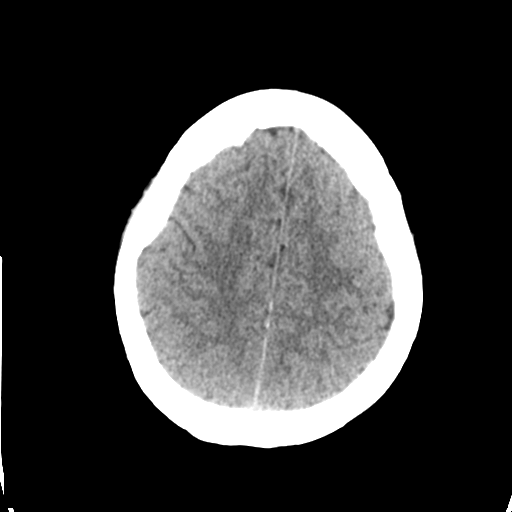
[im 25/30  brain]
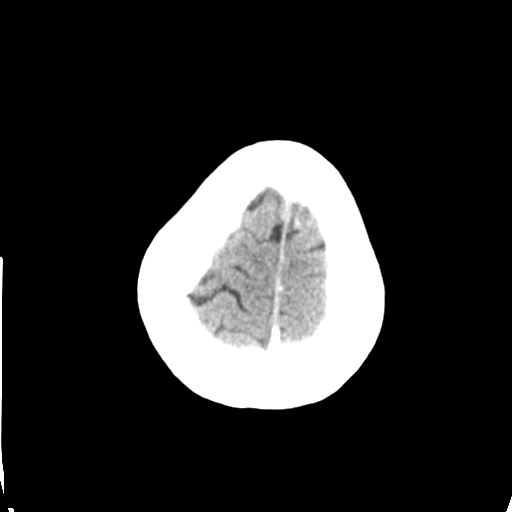
[im 25/30  bone]
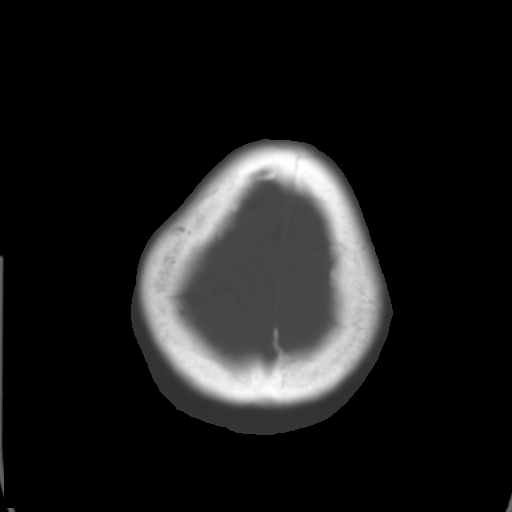
[im 27/30  brain]
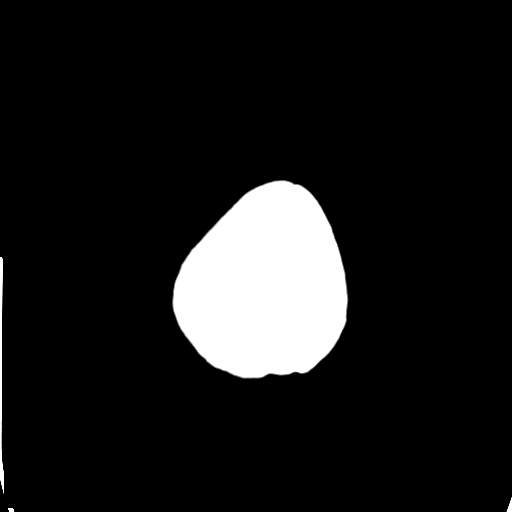

[Series 7: head with 3.0 mpr cor · coronal · 0.30mm/px · 3 of 67 slices shown]
[im 23/67  brain]
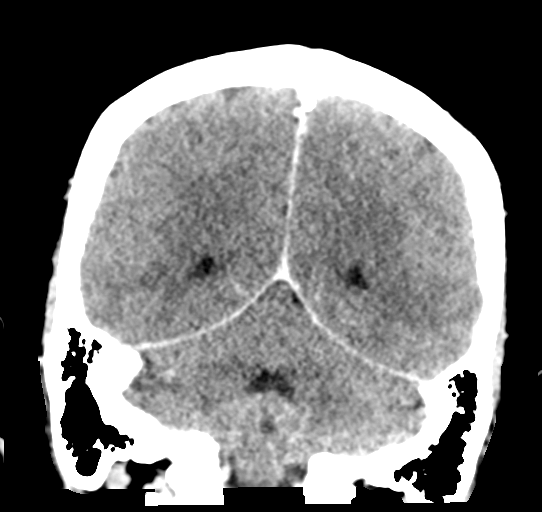
[im 30/67  brain]
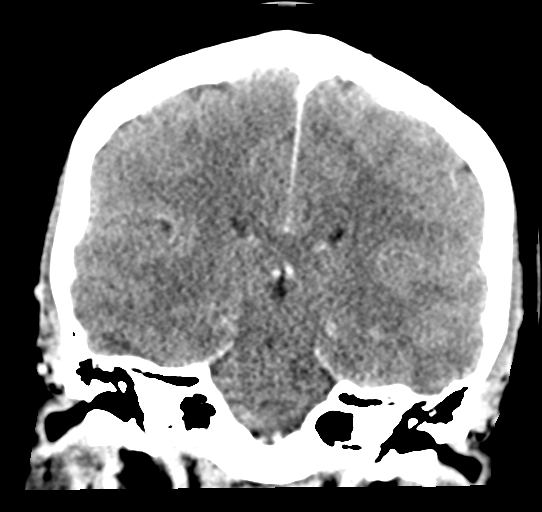
[im 37/67  brain]
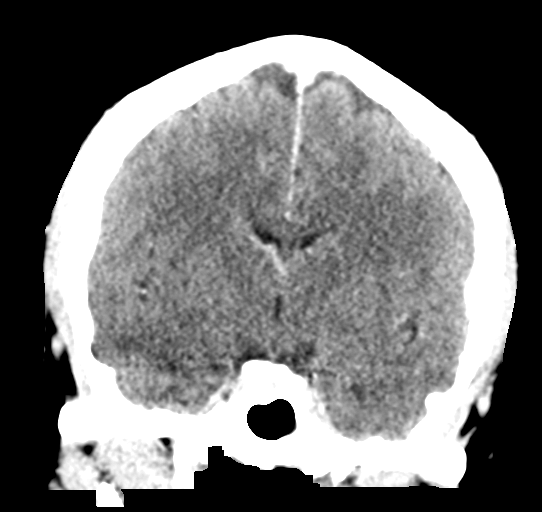

[Series 8: head with 3.0 mpr sag · sagittal · 0.30mm/px · 3 of 56 slices shown]
[im 19/56  brain]
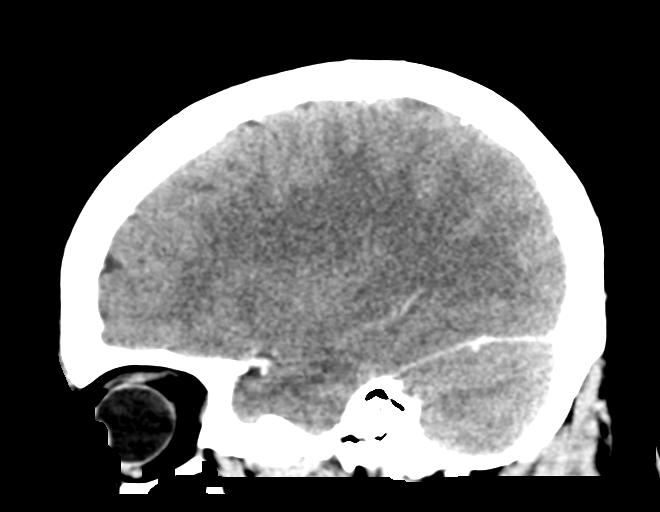
[im 28/56  brain]
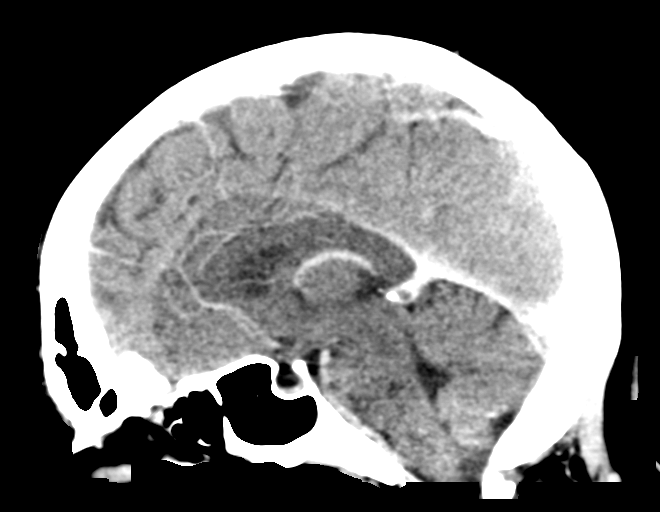
[im 37/56  brain]
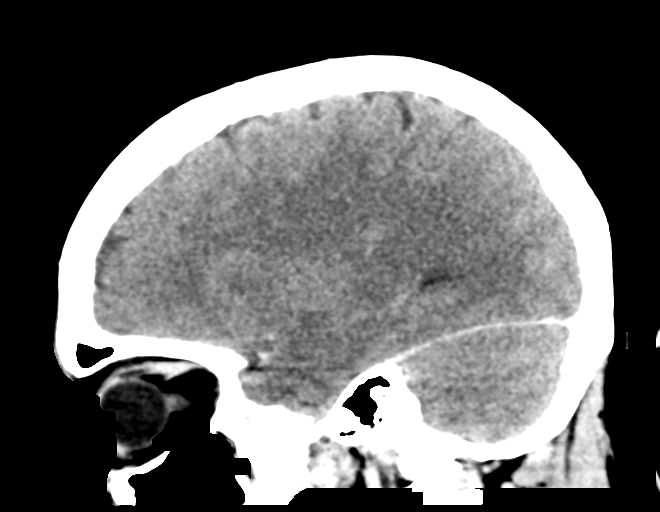

[16 of 47 positions shown; findings below may reference images not displayed]

FINDINGS: Brain: No mass lesion, intraparenchymal hemorrhage or extra-axial
collection. No evidence of acute cortical infarct. Brain parenchyma
and CSF-containing spaces are normal for age. No abnormal contrast
enhancement.

Vascular: No hyperdense vessel or unexpected calcification.

Skull: Normal visualized skull base, calvarium and extracranial soft
tissues.

Sinuses/Orbits: No sinus fluid levels or advanced mucosal
thickening. No mastoid effusion. Normal orbits.
IMPRESSION: Normal head CT.

## 2020-02-06 ENCOUNTER — Ambulatory Visit (HOSPITAL_COMMUNITY)
Admission: EM | Admit: 2020-02-06 | Discharge: 2020-02-06 | Disposition: A | Discharge status: Home / Self Care | Payer: Medicaid Other | Attending: Family Medicine | Admitting: Family Medicine

## 2020-02-06 ENCOUNTER — Other Ambulatory Visit: Payer: Self-pay

## 2020-02-06 ENCOUNTER — Encounter (HOSPITAL_COMMUNITY): Payer: Self-pay | Admitting: Emergency Medicine

## 2020-02-06 DIAGNOSIS — R05 Cough: Secondary | ICD-10-CM | POA: Diagnosis present

## 2020-02-06 DIAGNOSIS — R0602 Shortness of breath: Secondary | ICD-10-CM | POA: Diagnosis not present

## 2020-02-06 DIAGNOSIS — R519 Headache, unspecified: Secondary | ICD-10-CM | POA: Insufficient documentation

## 2020-02-06 DIAGNOSIS — R52 Pain, unspecified: Secondary | ICD-10-CM

## 2020-02-06 DIAGNOSIS — Z20822 Contact with and (suspected) exposure to covid-19: Secondary | ICD-10-CM | POA: Diagnosis not present

## 2020-02-06 DIAGNOSIS — R000 Tachycardia, unspecified: Secondary | ICD-10-CM | POA: Insufficient documentation

## 2020-02-06 DIAGNOSIS — R030 Elevated blood-pressure reading, without diagnosis of hypertension: Secondary | ICD-10-CM

## 2020-02-06 DIAGNOSIS — R059 Cough, unspecified: Secondary | ICD-10-CM

## 2020-02-06 LAB — POC SARS CORONAVIRUS 2 AG: SARS Coronavirus 2 Ag: NEGATIVE

## 2020-02-06 LAB — POC SARS CORONAVIRUS 2 AG -  ED
SARS Coronavirus 2 Ag: NEGATIVE
SARS Coronavirus 2 Ag: NEGATIVE

## 2020-02-06 MED ORDER — BENZONATATE 100 MG PO CAPS: 100.0000 mg | ORAL_CAPSULE | Freq: Three times a day (TID) | ORAL | 0 refills | Status: AC

## 2020-02-06 NOTE — ED Provider Notes (Signed)
[facility]   [id number] 02/06/20 Arrival Time: 1021  ASSESSMENT & PLAN:  1. Cough   2. SOB (shortness of breath)   3. Generalized body aches   4. Borderline high blood pressure      COVID-19 testing sent. See letter/work note on file for self-isolation guidelines. No indication for chest imaging. VSS here; slight tachycardia.  Follow-up Information    [facility].   Specialty: Urgent Care Why: As needed. Contact information: [address] [place] [address] [phone]           Discharge Instructions     You have been tested for COVID-19 today. If your test returns positive, you will receive a phone call from [facility] regarding your results. Negative test results are not called. Both positive and negative results area always visible on MyChart. If you do not have a MyChart account, sign up instructions are provided in your discharge papers. Please do not hesitate to contact us should you have questions or concerns.  Your blood pressure was noted to be elevated during your visit today. You may return here within the next few days to recheck if unable to see your primary care doctor. If your blood pressure remains persistently elevated, you may need to begin taking a medication.  BP (!) 146/95 (BP Location: Left Arm)   Pulse (!) 107   Temp 98.3 F (36.8 C) (Oral)   Resp 18   SpO2 100%       Reviewed expectations re: course of current medical issues. Questions answered. Outlined signs and symptoms indicating need for more acute intervention. Understanding verbalized. After Visit Summary given.   SUBJECTIVE: History from: patient. Allison Klein is a 44 y.o.apparently healthy female who reports abrupt onset of coughing, occasional feeling of being SOB with coughing, body aches, and a headache. Known COVID-19 contact: none2. Recent travel: none. Denies: fever. Normal PO intake without  n/v/d.  Borderline blood pressure noted today. Reports that she has not been treated for hypertension in the past.  She reports no chest pain on exertion, no swelling of ankles, no orthostatic dizziness or lightheadedness, no orthopnea or paroxysmal nocturnal dyspnea, no palpitations and no intermittent claudication symptoms.   Social History   Tobacco Use  Smoking Status Never Smoker  Smokeless Tobacco Never Used    OBJECTIVE:  Vitals:   02/06/20 1043  BP: (!) 146/95  Pulse: (!) 107  Resp: 18  Temp: 98.3 F (36.8 C)  TempSrc: Oral  SpO2: 100%    General appearance: alert; no distress; appears fatigued Eyes: PERRLA; EOMI; conjunctiva normal HENT: [place]; AT; nasal congestion Neck: supple  CV: slight tachycardia Lungs: speaks full sentences without difficulty; unlabored Extremities: no edema Skin: warm and dry Neurologic: normal gait Psychological: alert and cooperative; normal mood and affect  Labs Reviewed: Results for orders placed or performed during the hospital encounter of 04/29/17  Basic metabolic panel  Result Value Ref Range   Sodium 136 135 - 145 mmol/L   Potassium 3.9 3.5 - 5.1 mmol/L   Chloride 105 101 - 111 mmol/L   CO2 24 22 - 32 mmol/L   Glucose, Bld 90 65 - 99 mg/dL   BUN 12 6 - 20 mg/dL   Creatinine, Ser 7.40 0.44 - 1.00 mg/dL   Calcium 9.2 8.9 - 81.4 mg/dL   GFR calc non Af Amer >60 >60 mL/min   GFR calc Af Amer >60 >60 mL/min   Anion gap 7  5 - 15  CBC  Result Value Ref Range   WBC 7.4 4.0 - 10.5 K/uL   RBC 4.51 3.87 - 5.11 MIL/uL   Hemoglobin 12.8 12.0 - 15.0 g/dL   HCT 39.7 36.0 - 46.0 %   MCV 88.0 78.0 - 100.0 fL   MCH 28.4 26.0 - 34.0 pg   MCHC 32.2 30.0 - 36.0 g/dL   RDW 12.7 11.5 - 15.5 %   Platelets 283 150 - 400 K/uL  CBG monitoring, ED  Result Value Ref Range   Glucose-Capillary 62 (L) 65 - 99 mg/dL   Labs Reviewed  POC SARS CORONAVIRUS 2 AG -  ED    No Known Allergies  History reviewed. No pertinent past medical history.    Social History   Socioeconomic History  . Marital status: Single    Spouse name: Not on file  . Number of children: Not on file  . Years of education: Not on file  . Highest education level: Not on file  Occupational History  . Not on file  Tobacco Use  . Smoking status: Never Smoker  . Smokeless tobacco: Never Used  Substance and Sexual Activity  . Alcohol use: No  . Drug use: Not on file  . Sexual activity: Not on file  Other Topics Concern  . Not on file  Social History Narrative  . Not on file   Social Determinants of Health   Financial Resource Strain:   . Difficulty of Paying Living Expenses: Not on file  Food Insecurity:   . Worried About Charity fundraiser in the Last Year: Not on file  . Ran Out of Food in the Last Year: Not on file  Transportation Needs:   . Lack of Transportation (Medical): Not on file  . Lack of Transportation (Non-Medical): Not on file  Physical Activity:   . Days of Exercise per Week: Not on file  . Minutes of Exercise per Session: Not on file  Stress:   . Feeling of Stress : Not on file  Social Connections:   . Frequency of Communication with Friends and Family: Not on file  . Frequency of Social Gatherings with Friends and Family: Not on file  . Attends Religious Services: Not on file  . Active Member of Clubs or Organizations: Not on file  . Attends Archivist Meetings: Not on file  . Marital Status: Not on file  Intimate Partner Violence:   . Fear of Current or Ex-Partner: Not on file  . Emotionally Abused: Not on file  . Physically Abused: Not on file  . Sexually Abused: Not on file   History reviewed. No pertinent family history. Past Surgical History:  Procedure Laterality Date  . ABDOMINAL SURGERY       Vanessa Kick, MD 02/06/20 1154

## 2020-02-06 NOTE — Discharge Instructions (Addendum)
You have been tested for COVID-19 today. If your test returns positive, you will receive a phone call from [facility] regarding your results. Negative test results are not called. Both positive and negative results area always visible on MyChart. If you do not have a MyChart account, sign up instructions are provided in your discharge papers. Please do not hesitate to contact us should you have questions or concerns.  Your blood pressure was noted to be elevated during your visit today. You may return here within the next few days to recheck if unable to see your primary care doctor. If your blood pressure remains persistently elevated, you may need to begin taking a medication.  BP (!) 146/95 (BP Location: Left Arm)   Pulse (!) 107   Temp 98.3 F (36.8 C) (Oral)   Resp 18   SpO2 100%

## 2020-02-06 NOTE — ED Triage Notes (Signed)
Pt here with cough, SOB, body aches and HA x 2 days

## 2020-02-09 LAB — NOVEL CORONAVIRUS, NAA (HOSP ORDER, SEND-OUT TO REF LAB; TAT 18-24 HRS): SARS-CoV-2, NAA: NOT DETECTED

## 2021-06-29 ENCOUNTER — Other Ambulatory Visit: Payer: Self-pay | Admitting: Sports Medicine

## 2021-06-29 ENCOUNTER — Ambulatory Visit
Admission: RE | Admit: 2021-06-29 | Discharge: 2021-06-29 | Disposition: A | Discharge status: Home / Self Care | Payer: Medicaid Other | Source: Ambulatory Visit | Attending: Sports Medicine | Admitting: Sports Medicine

## 2021-06-29 DIAGNOSIS — M25561 Pain in right knee: Secondary | ICD-10-CM

## 2022-03-23 IMAGING — DX DG KNEE 3 VIEWS*R*
3 series · 3 of 3 positions shown · non-contrast
Comparison: None.

CLINICAL DATA: Right knee pain, no injury.

EXAM:
RIGHT KNEE - 3 VIEW

[dg knee 3 views right (1 of 3)]
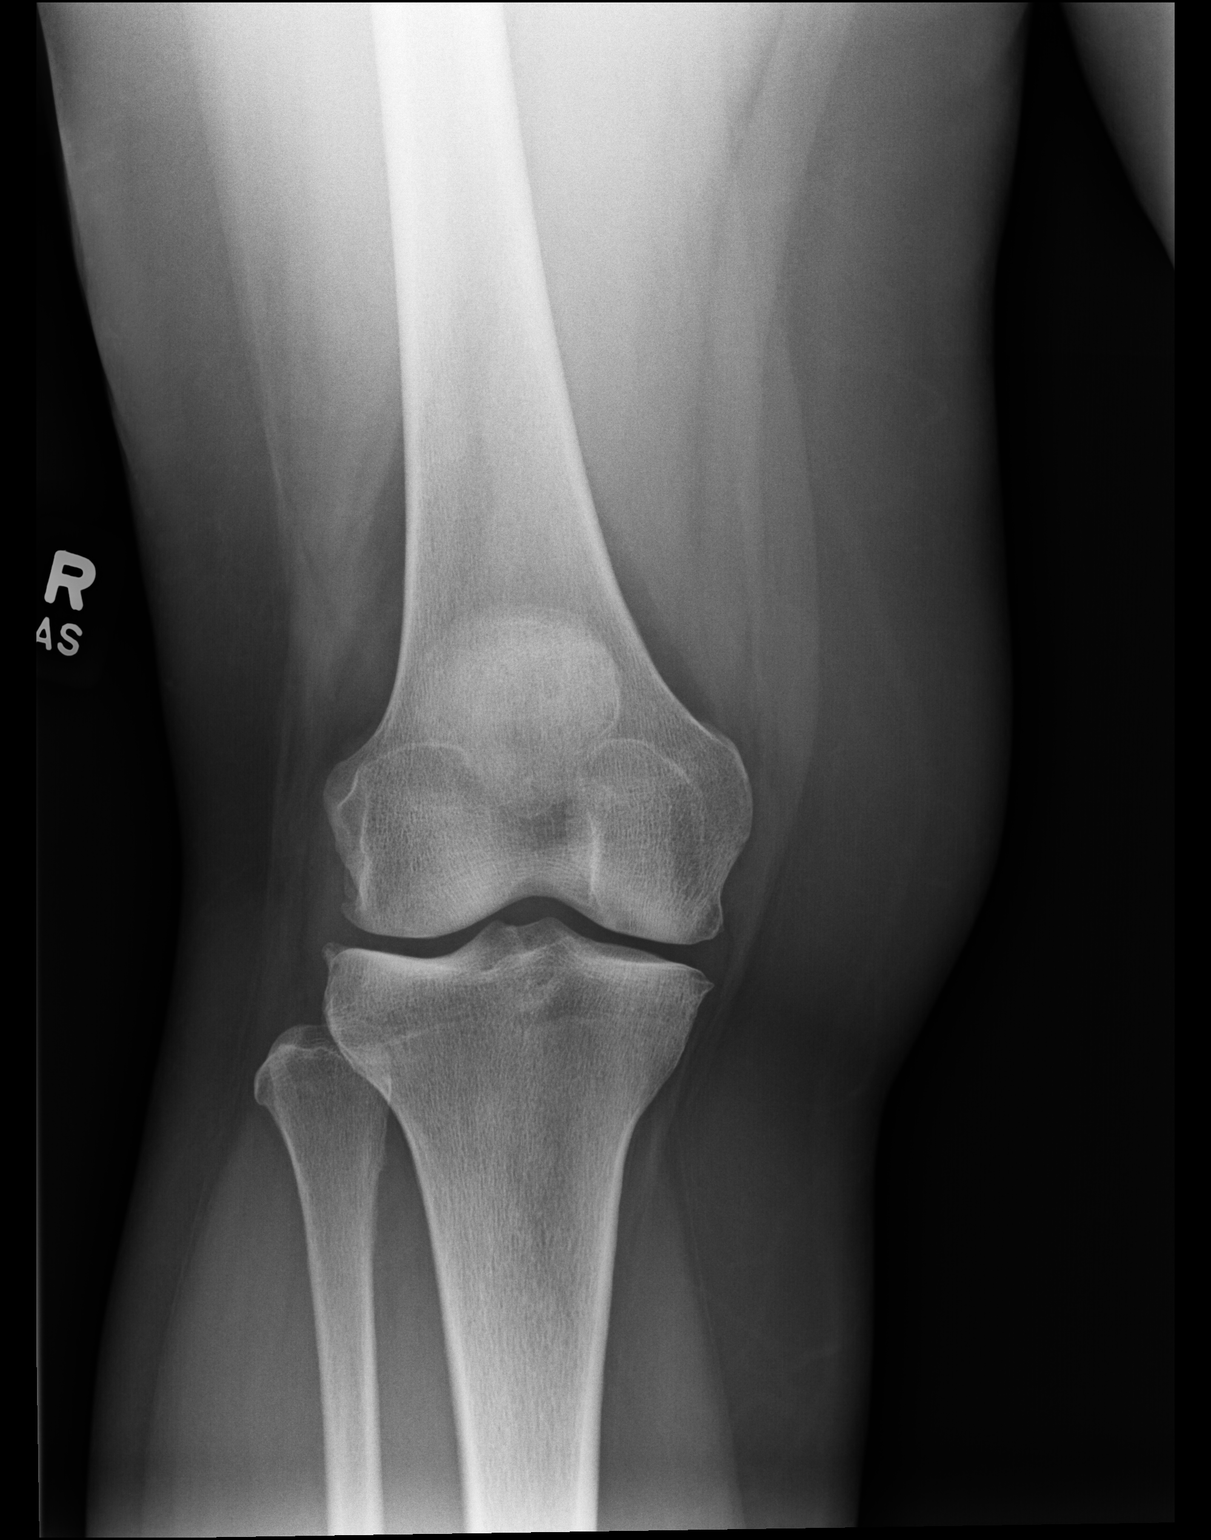

[dg knee 3 views right (2 of 3)]
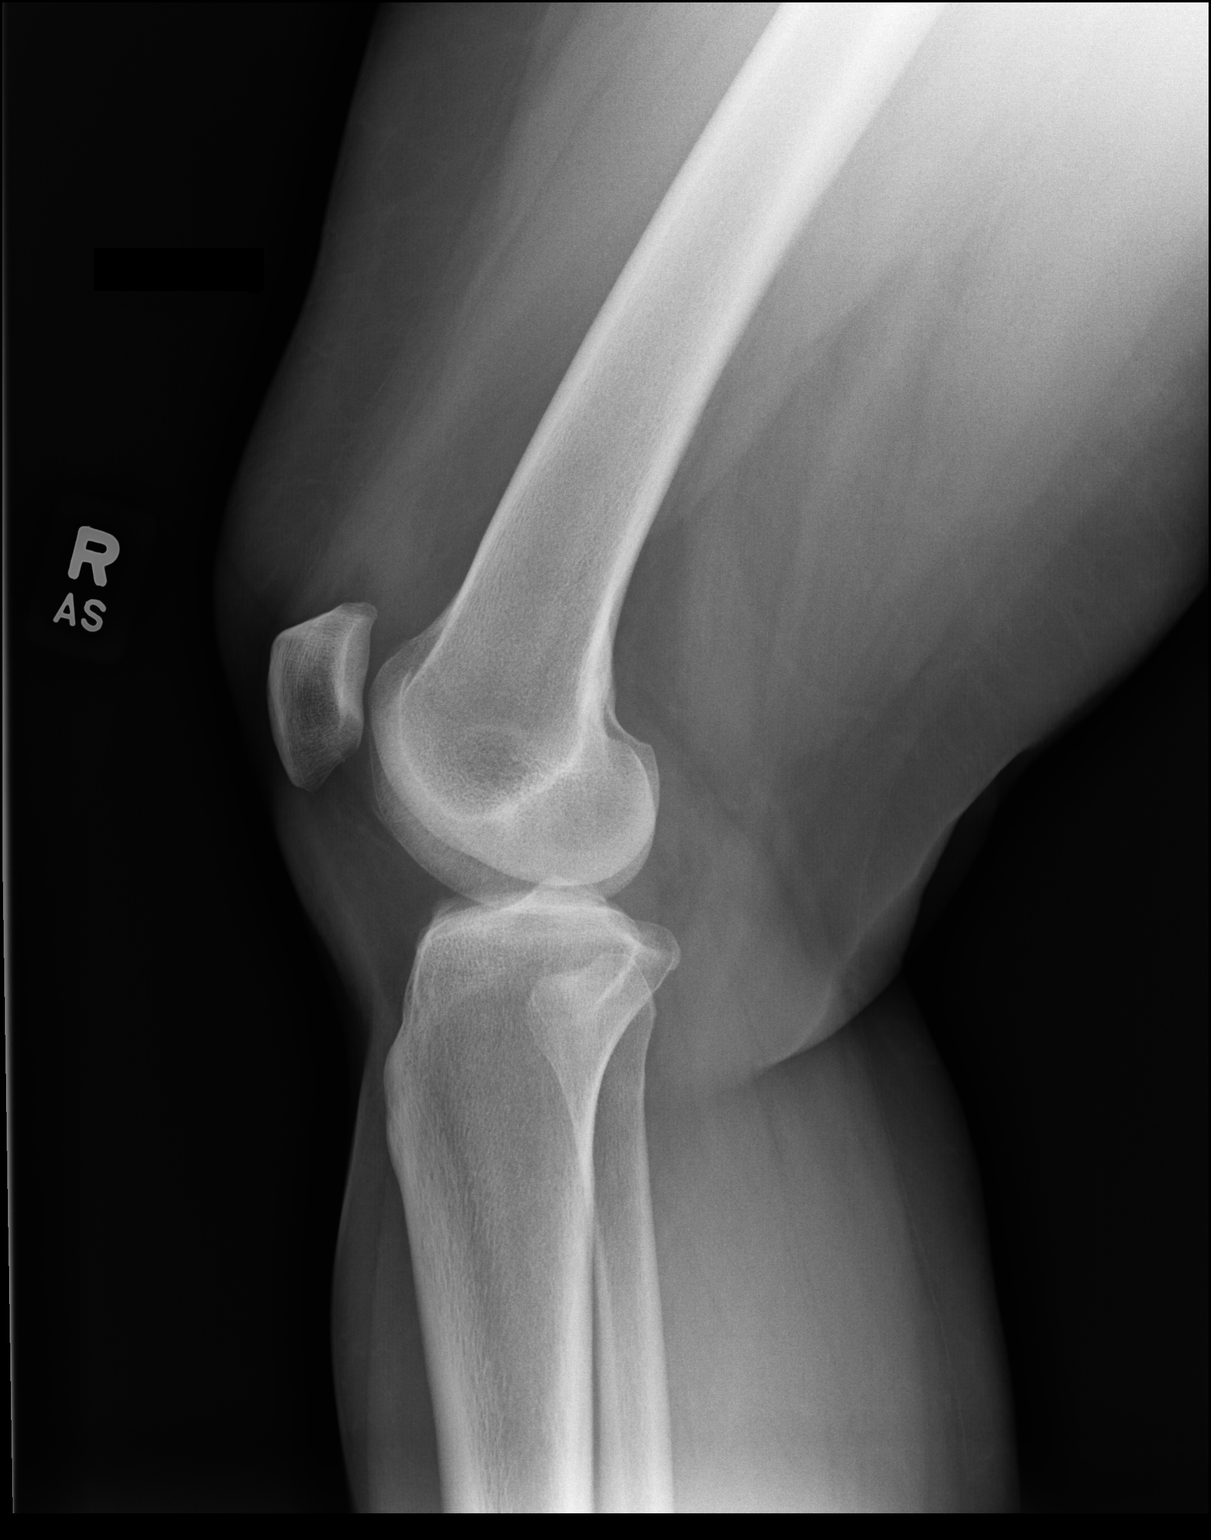

[dg knee 3 views right (3 of 3)]
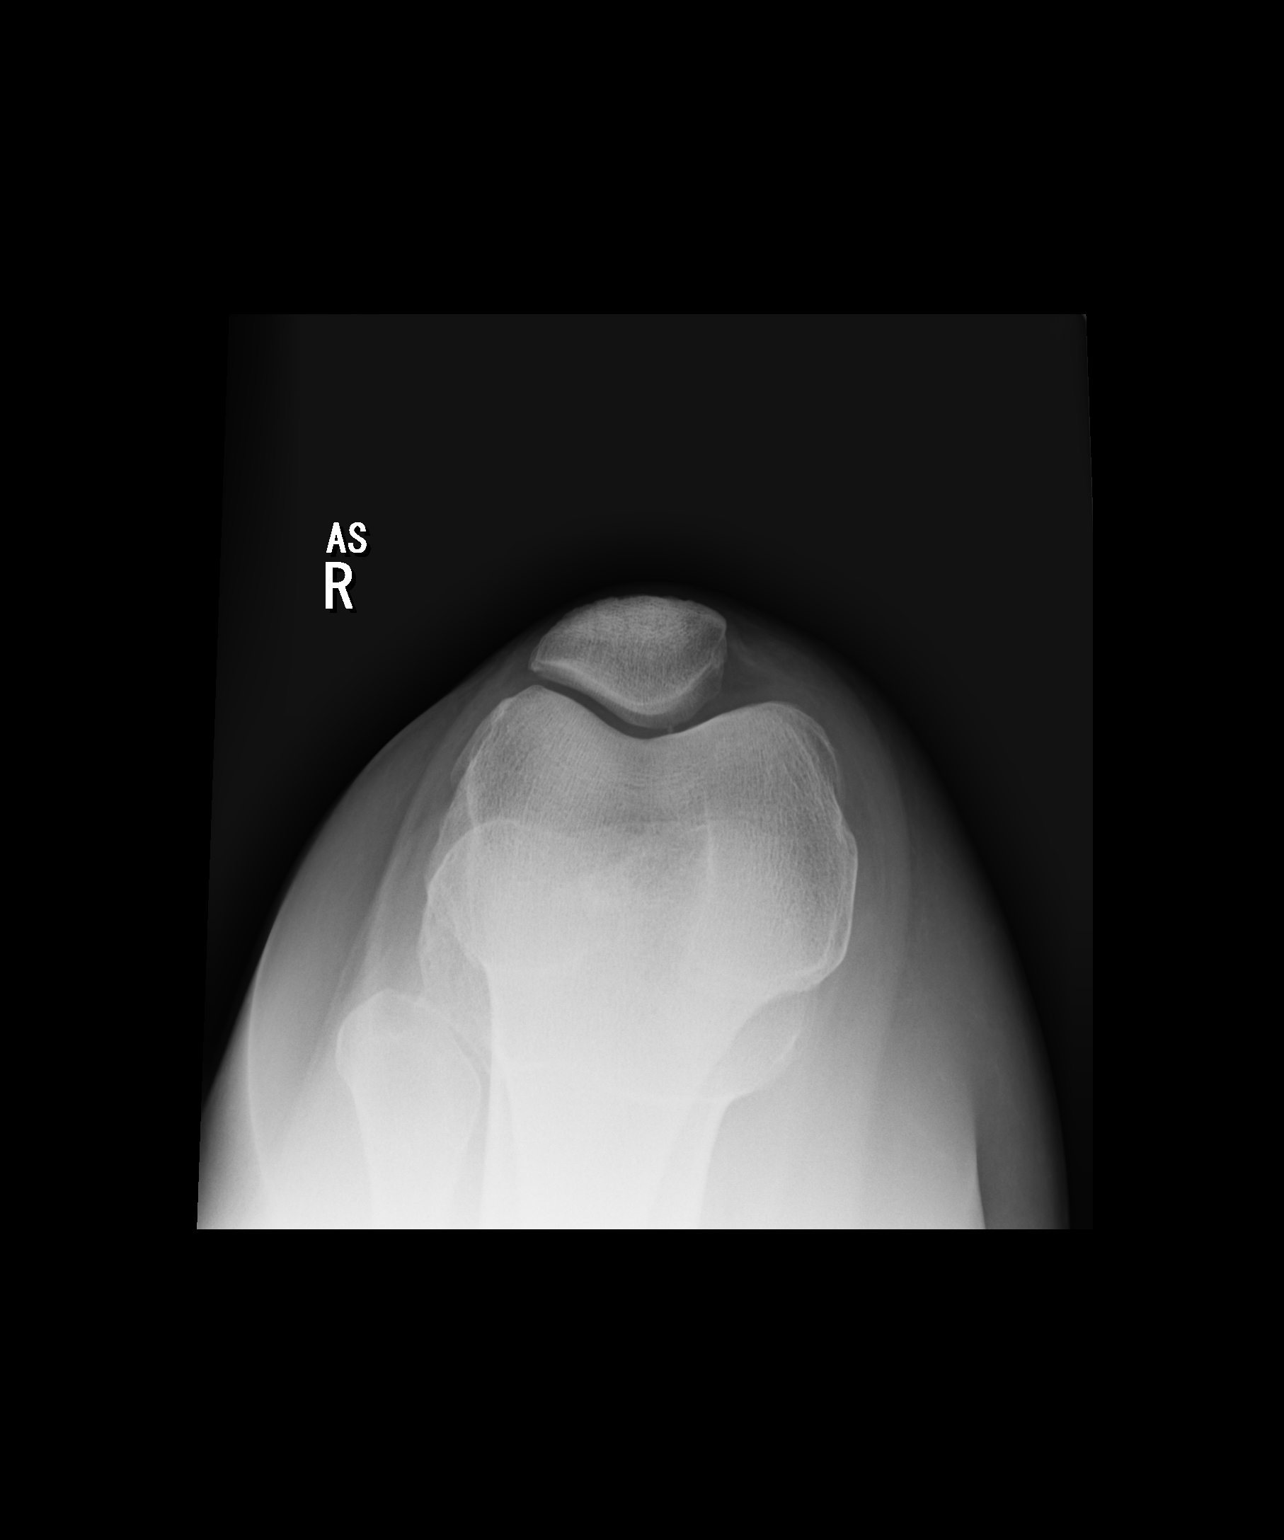

[3 of 3 positions shown; findings below may reference images not displayed]

FINDINGS: No joint effusion.  Trace medial and lateral osteophytosis.
IMPRESSION: 1. No acute findings.
2. Trace medial and lateral osteophytosis.

## 2023-03-30 ENCOUNTER — Other Ambulatory Visit: Payer: Self-pay | Admitting: Sports Medicine

## 2023-03-30 DIAGNOSIS — M5441 Lumbago with sciatica, right side: Secondary | ICD-10-CM

## 2023-05-02 ENCOUNTER — Ambulatory Visit
Admission: RE | Admit: 2023-05-02 | Discharge: 2023-05-02 | Disposition: A | Discharge status: Home / Self Care | Payer: Managed Care, Other (non HMO) | Source: Ambulatory Visit | Attending: Sports Medicine | Admitting: Sports Medicine

## 2023-05-02 DIAGNOSIS — M5441 Lumbago with sciatica, right side: Secondary | ICD-10-CM

## 2023-08-01 ENCOUNTER — Emergency Department (HOSPITAL_COMMUNITY)
Admission: EM | Admit: 2023-08-01 | Discharge: 2023-08-01 | Disposition: A | Discharge status: Home / Self Care | Payer: Managed Care, Other (non HMO) | Attending: Emergency Medicine | Admitting: Emergency Medicine

## 2023-08-01 ENCOUNTER — Other Ambulatory Visit: Payer: Self-pay

## 2023-08-01 ENCOUNTER — Emergency Department (HOSPITAL_COMMUNITY): Payer: Managed Care, Other (non HMO)

## 2023-08-01 DIAGNOSIS — M542 Cervicalgia: Secondary | ICD-10-CM | POA: Diagnosis not present

## 2023-08-01 DIAGNOSIS — R072 Precordial pain: Secondary | ICD-10-CM | POA: Insufficient documentation

## 2023-08-01 DIAGNOSIS — R519 Headache, unspecified: Secondary | ICD-10-CM | POA: Insufficient documentation

## 2023-08-01 DIAGNOSIS — R079 Chest pain, unspecified: Secondary | ICD-10-CM | POA: Diagnosis present

## 2023-08-01 LAB — CBC WITH DIFFERENTIAL/PLATELET
Abs Immature Granulocytes: 0.02 10*3/uL (ref 0.00–0.07)
Basophils Absolute: 0 10*3/uL (ref 0.0–0.1)
Basophils Relative: 0 %
Eosinophils Absolute: 0 10*3/uL (ref 0.0–0.5)
Eosinophils Relative: 0 %
HCT: 36.5 % (ref 36.0–46.0)
Hemoglobin: 11.9 g/dL — ABNORMAL LOW (ref 12.0–15.0)
Immature Granulocytes: 0 %
Lymphocytes Relative: 17 %
Lymphs Abs: 1.2 10*3/uL (ref 0.7–4.0)
MCH: 29.2 pg (ref 26.0–34.0)
MCHC: 32.6 g/dL (ref 30.0–36.0)
MCV: 89.5 fL (ref 80.0–100.0)
Monocytes Absolute: 0.5 10*3/uL (ref 0.1–1.0)
Monocytes Relative: 7 %
Neutro Abs: 5.5 10*3/uL (ref 1.7–7.7)
Neutrophils Relative %: 76 %
Platelets: 277 10*3/uL (ref 150–400)
RBC: 4.08 MIL/uL (ref 3.87–5.11)
RDW: 13.1 % (ref 11.5–15.5)
WBC: 7.4 10*3/uL (ref 4.0–10.5)
nRBC: 0 % (ref 0.0–0.2)

## 2023-08-01 LAB — BASIC METABOLIC PANEL
Anion gap: 8 (ref 5–15)
BUN: 9 mg/dL (ref 6–20)
CO2: 24 mmol/L (ref 22–32)
Calcium: 8.7 mg/dL — ABNORMAL LOW (ref 8.9–10.3)
Chloride: 104 mmol/L (ref 98–111)
Creatinine, Ser: 0.74 mg/dL (ref 0.44–1.00)
GFR, Estimated: >60 mL/min (ref >60)
Glucose, Bld: 104 mg/dL — ABNORMAL HIGH (ref 70–99)
Potassium: 4 mmol/L (ref 3.5–5.1)
Sodium: 136 mmol/L (ref 135–145)

## 2023-08-01 LAB — D-DIMER, QUANTITATIVE: D-Dimer, Quant: 1.53 ug/mL-FEU — ABNORMAL HIGH (ref 0.00–0.50)

## 2023-08-01 LAB — TROPONIN I (HIGH SENSITIVITY)
Troponin I (High Sensitivity): <2 ng/L (ref <18)
Troponin I (High Sensitivity): <2 ng/L (ref <18)

## 2023-08-01 MED ORDER — KETOROLAC TROMETHAMINE 15 MG/ML IJ SOLN
15.0000 mg | Freq: Once | INTRAMUSCULAR | Status: AC
  Administered 2023-08-01: 15 mg via INTRAVENOUS
  Filled 2023-08-01: qty 1

## 2023-08-01 MED ORDER — DIPHENHYDRAMINE HCL 50 MG/ML IJ SOLN
25.0000 mg | Freq: Once | INTRAMUSCULAR | Status: AC
  Administered 2023-08-01: 25 mg via INTRAVENOUS
  Filled 2023-08-01: qty 1

## 2023-08-01 MED ORDER — IOHEXOL 350 MG/ML SOLN
75.0000 mL | Freq: Once | INTRAVENOUS | Status: AC | PRN
  Administered 2023-08-01: 75 mL via INTRAVENOUS

## 2023-08-01 MED ORDER — PROCHLORPERAZINE EDISYLATE 10 MG/2ML IJ SOLN
10.0000 mg | Freq: Once | INTRAMUSCULAR | Status: AC
  Administered 2023-08-01: 10 mg via INTRAVENOUS
  Filled 2023-08-01: qty 2

## 2023-08-01 MED ORDER — NITROGLYCERIN 0.4 MG SL SUBL
0.4000 mg | SUBLINGUAL_TABLET | SUBLINGUAL | Status: DC | PRN
  Administered 2023-08-01 (×2): 0.4 mg via SUBLINGUAL
  Filled 2023-08-01 (×2): qty 1

## 2023-08-01 MED ORDER — SODIUM CHLORIDE (PF) 0.9 % IJ SOLN
INTRAMUSCULAR | Status: AC
  Filled 2023-08-01: qty 50

## 2023-08-01 MED ORDER — MORPHINE SULFATE (PF) 4 MG/ML IV SOLN: 4.0000 mg | Freq: Once | INTRAVENOUS | Status: DC | PRN

## 2023-08-01 NOTE — ED Provider Notes (Addendum)
[place] EMERGENCY DEPARTMENT AT [facility] Provider Note   CSN: [id number] Arrival date & time: 08/01/23  0755     History  Chief Complaint  Patient presents with   Chest Pain   Neck Pain    Allison Klein is Allison 47 y.o. Klein.  HPI    47 year old Klein comes in with chief complaint of chest pain, neck pain, headaches.  Patient's primary complaint is chest pain.  She indicates that she has been having off-and-on chest pain over the last 2 or 3 months.  She typically will get couple of episodes Allison week.  However, last night the chest discomfort woke her up from her sleep around 4 AM.  She also had associated shortness of breath.  She denies any palpitations, nausea, diaphoresis.  Patient also complains of headaches and right-sided neck pain.  The symptoms are independent of her chest pain and have also been present for several months now.  Occasionally patient will feel like her ears are popping.  She has no tinnitus or pulsatile sound in her ears.  Patient's past medical history is positive for PCOS and she is on birth control.  No history of DVT, PE she denies any substance use disorder.  Home Medications Prior to Admission medications   Medication Sig Start Date End Date Taking? Authorizing Provider  benzonatate (TESSALON) 100 MG capsule Take 1 capsule (100 mg total) by mouth every 8 (eight) hours. 02/06/20   Mardella Layman, MD  fluticasone (FLONASE) 50 MCG/ACT nasal spray Place 2 sprays into both nostrils daily. 04/19/17 04/29/17  Leath-Warren, Sadie Haber, NP  montelukast (SINGULAIR) 10 MG tablet Take 1 tablet (10 mg total) by mouth at bedtime. 04/19/17 05/19/17  Leath-Warren, Sadie Haber, NP      Allergies    Patient has no known allergies.    Review of Systems   Review of Systems  All other systems reviewed and are negative.   Physical Exam Updated Vital Signs BP 128/80 (BP Location: Left Arm)   Pulse 72   Temp 98.3 F (36.8 C)   Resp 20   Ht 5\' 3"  (1.6  m)   Wt 90.3 kg   LMP 07/25/2023 (Approximate)   SpO2 100%   BMI 35.25 kg/m  Physical Exam Vitals and nursing note reviewed.  Constitutional:      Appearance: She is well-developed.  HENT:     Head: Atraumatic.  Cardiovascular:     Rate and Rhythm: Normal rate.  Pulmonary:     Effort: Pulmonary effort is normal.     Breath sounds: Normal breath sounds.  Musculoskeletal:     Cervical back: Normal range of motion and neck supple.  Skin:    General: Skin is warm and dry.  Neurological:     Mental Status: She is alert and oriented to person, place, and time.     ED Results / Procedures / Treatments   Labs (all labs ordered are listed, but only abnormal results are displayed) Labs Reviewed  BASIC METABOLIC PANEL - Abnormal; Notable for the following components:      Result Value   Glucose, Bld 104 (*)    Calcium 8.7 (*)    All other components within normal limits  CBC WITH DIFFERENTIAL/PLATELET - Abnormal; Notable for the following components:   Hemoglobin 11.9 (*)    All other components within normal limits  D-DIMER, QUANTITATIVE - Abnormal; Notable for the following components:   D-Dimer, Quant 1.53 (*)    All other components  within normal limits  TROPONIN I (HIGH SENSITIVITY)  TROPONIN I (HIGH SENSITIVITY)    EKG EKG Interpretation Date/Time:  Monday August 01 2023 08:01:42 EDT Ventricular Rate:  102 PR Interval:    QRS Duration:  88 QT Interval:  317 QTC Calculation: 413 R Axis:   60  Text Interpretation: aflutter vs nsr normal intervals No acute changes No old tracing to compare Confirmed by Derwood Kaplan [phone]) on 08/01/2023 9:18:35 AM   EKG Interpretation Date/Time:  Monday August 01 2023 09:33:15 EDT Ventricular Rate:  84 PR Interval:  137 QRS Duration:  85 QT Interval:  340 QTC Calculation: 402 R Axis:   45  Text Interpretation: Sinus rhythm ST elev, probable normal early repol pattern repeat ekg show no clear evidence of flutter wave  Confirmed by Derwood Kaplan ([id number]) on 08/01/2023 10:23:02 AM         Radiology CT Angio Chest PE W and/or Wo Contrast  Result Date: 08/01/2023 CLINICAL DATA:  Rule out pulmonary embolism. Low to intermediate probability. Positive D-dimer. EXAM: CT ANGIOGRAPHY CHEST WITH CONTRAST TECHNIQUE: Multidetector CT imaging of the chest was performed using the standard protocol during bolus administration of intravenous contrast. Multiplanar CT image reconstructions and MIPs were obtained to evaluate the vascular anatomy. RADIATION DOSE REDUCTION: This exam was performed according to the departmental dose-optimization program which includes automated exposure control, adjustment of the mA and/or kV according to patient size and/or use of iterative reconstruction technique. CONTRAST:  75mL OMNIPAQUE IOHEXOL 350 MG/ML SOLN COMPARISON:  None Available. FINDINGS: Cardiovascular: Satisfactory opacification of the pulmonary arteries to the segmental level. No evidence of pulmonary embolism. Normal heart size. No pericardial effusion. Mediastinum/Nodes: No enlarged mediastinal, hilar, or axillary lymph nodes. Thyroid gland, trachea, and esophagus demonstrate no significant findings. Lungs/Pleura: No pleural effusion, airspace consolidation, or pneumothorax. Right middle lobe lung nodule is identified measuring 4 mm, image 93/3. Upper Abdomen: No acute abnormality. Musculoskeletal: No chest wall abnormality. No acute or significant osseous findings. Review of the MIP images confirms the above findings. IMPRESSION: 1. No evidence for acute pulmonary embolus. 2. No acute findings within the chest. 3. Right middle lobe lung nodule measuring 4 mm. Per Fleischner Society Guidelines, no routine follow-up imaging is recommended. Electronically Signed   By: Signa Kell M.D.   On: 08/01/2023 11:03   DG Chest Port 1 View  Result Date: 08/01/2023 CLINICAL DATA:  Chest pain EXAM: PORTABLE CHEST 1 VIEW COMPARISON:  None  Available. FINDINGS: Artifact from EKG leads. Normal heart size and mediastinal contours. No acute infiltrate or edema. No effusion or pneumothorax. No acute osseous findings. IMPRESSION: Negative portable chest. Electronically Signed   By: Tiburcio Pea M.D.   On: 08/01/2023 10:09    Procedures Procedures    Medications Ordered in ED Medications  nitroGLYCERIN (NITROSTAT) SL tablet 0.4 mg (0.4 mg Sublingual Given 08/01/23 0919)  morphine (PF) 4 MG/ML injection 4 mg (has no administration in time range)  prochlorperazine (COMPAZINE) injection 10 mg (10 mg Intravenous Given 08/01/23 0927)  diphenhydrAMINE (BENADRYL) injection 25 mg (25 mg Intravenous Given 08/01/23 0927)  iohexol (OMNIPAQUE) 350 MG/ML injection 75 mL (75 mLs Intravenous Contrast Given 08/01/23 1034)  ketorolac (TORADOL) 15 MG/ML injection 15 mg (15 mg Intravenous Given 08/01/23 1111)    ED Course/ Medical Decision Making/ Allison&P                                 Medical Decision  Making Amount and/or Complexity of Data Reviewed Labs: ordered. Radiology: ordered.  Risk Prescription drug management.  47 year old Klein with history of PCOS -currently on birth control comes in with chief complaint of intermittent chest discomfort over the last 2 or 3 months.  This episode woke her up from her sleep and she also had associated shortness of breath.  She has independent complaint of headache and neck pain as well.  Differential diagnosis considered for this patient includes acute coronary syndrome, arrhythmia including PACs/Allison-fib/flutter, PSVT, severe anemia, pulmonary embolism.  Headaches are nonspecific, currently at 8 out of 10.  No associated neurologic symptoms.  Headaches are not thunderclap, chronic in nature.  No trauma.  Doubt any acute etiology/emergent process for the headaches.  Patient has no meningismus and I do not think there is any meningitis workup indicated at this time.  Clinically does not appear that patient has  dissection -aortic or vertebral/carotid.  Plan is to get basic labs. Initial EKG appears sinus, although questioning if there are some flutter waves.  12:56 PM I reviewed patient's cardiac telemetry strip.  Patient has not had any episodes of arrhythmia.  Delta troponin is undetectable and patient's CT PE is negative.  Results of the ED workup discussed with her.  Headache has improved.  Chest pain has persisted.  Less likely for this to be ACS in the setting of patient having ongoing chest pain with negative troponins, especially since she does not have any significant cardiac risk factors.  Will advise PCP follow-up.  Final Clinical Impression(s) / ED Diagnoses Final diagnoses:  Precordial chest pain  Nonintractable episodic headache, unspecified headache type    Rx / DC Orders ED Discharge Orders     None          Derwood Kaplan, MD 08/01/23 1257

## 2023-08-01 NOTE — Discharge Instructions (Addendum)
We saw you in the ER for chest discomfort, palpitation and headaches All the results in the ER are normal, labs and imaging. We have not seen any cardiac arrhythmia in the emergency room. We are not sure what is causing your symptoms. The workup in the ER is not complete, and is limited to screening for life threatening and emergent conditions only, so please see a primary care doctor for further evaluation.  We recommend that he follow-up with your primary care doctor for further assessment.  Please return to the ER if you have worsening chest pain, shortness of breath, pain radiating to your jaw, shoulder, or back, sweats or fainting. Otherwise see the Cardiologist or your primary care doctor as requested.

## 2023-08-01 NOTE — ED Notes (Signed)
Pt discharged home. Discharge information discussed. No s/s of distress observed during discharge. 

## 2023-08-01 NOTE — ED Notes (Signed)
Patient arrived POV. Patient reports chest pain/pressure for months, right side. States also hurts with movement. Endorses headache and "difficulty catching breath". Reports this morning she came due to the pressure not stopping. Denies recent travel. AAOX4, respirations even and unlabored. NAD. Denies any other complaints

## 2024-05-23 NOTE — Progress Notes (Addendum)
 Subjective Patient ID: Allison Klein is a 48 y.o. female.  Allison Klein is a 48 y.o. female who presents to the clinic for evaluation of persistent nasal congestion over the past 2 weeks.  Patient states that the onset of symptoms symptoms were increased.  States that overall symptoms have worsened since then.  Patient reports having symptoms of fatigue, postnasal drip, mild cough, chest tightness, nausea, and dizziness lightheadedness.  Patient does report to having some sinus pressure both maxillary and frontal in which she points to behind her eyes.  She states that her youngest son who was sick prior to her however feeling better.  She states that her oldest son is sick with very similar symptoms now.  Denies any recent travel outside [place] . Pt requesting Diflucan  if she is being prescribed oral antibiotic.     History provided by:  Patient Language interpreter used: No     Review of Systems  Constitutional:  Positive for fatigue. Negative for chills, diaphoresis and fever.  HENT:  Positive for congestion and postnasal drip.   Respiratory:  Positive for cough (mild) and chest tightness. Negative for shortness of breath and wheezing.   Gastrointestinal:  Positive for nausea. Negative for abdominal pain, diarrhea and vomiting.  Skin:  Negative for rash.  Neurological:  Positive for dizziness, light-headedness and headaches. Negative for syncope.    Patient History  Allergies: No Known Allergies   History reviewed. No pertinent past medical history. Past Surgical History:  Procedure Laterality Date  . CESAREAN SECTION, CLASSIC    . CESAREAN SECTION, CLASSIC    . EYE MUSCLE SURGERY    . KNEE CARTILAGE SURGERY Right    Social History   Socioeconomic History  . Marital status: Single    Spouse name: Not on file  . Number of children: Not on file  . Years of education: Not on file  . Highest education level: Not on file  Occupational History  . Not on file  Tobacco  Use  . Smoking status: Never  . Smokeless tobacco: Never  Vaping Use  . Vaping status: Never Used  Substance and Sexual Activity  . Alcohol use: Never  . Drug use: Never  . Sexual activity: Yes    Partners: Male  Other Topics Concern  . Not on file  Social History Narrative  . Not on file   History reviewed. No pertinent family history. Current Outpatient Medications on File Prior to Visit  Medication Sig Dispense Refill  . Nikki 3-0.02 MG tablet     . spironolactone (Aldactone) 50 MG tablet Take 50 mg by mouth 1 (one) time each day.     No current facility-administered medications on file prior to visit.     Objective  Vitals:   05/23/24 0927  BP: 123/82  Pulse: 90  Resp: 18  Temp: (!) 36.4 C (97.5 F)  SpO2: 99%  Weight: 82.1 kg  Height: 5' 6  PainSc: 0-No pain  LMP: 05/14/2024        OBGYN/Pregnancy Status: Having periods       No results found.  Physical Exam Vitals and nursing note reviewed.  Constitutional:      General: She is not in acute distress.    Appearance: Normal appearance. She is normal weight. She is not ill-appearing, toxic-appearing or diaphoretic.  HENT:     Head: Normocephalic and atraumatic.     Right Ear: Tympanic membrane, ear canal and external ear normal.     Left Ear: Tympanic membrane,  ear canal and external ear normal.     Nose: Congestion present. No rhinorrhea.     Right Turbinates: Enlarged.     Left Turbinates: Enlarged.     Right Sinus: No maxillary sinus tenderness or frontal sinus tenderness.     Left Sinus: No maxillary sinus tenderness or frontal sinus tenderness.     Mouth/Throat:     Lips: Pink.     Mouth: Mucous membranes are moist.     Pharynx: Oropharynx is clear. Uvula midline. Postnasal drip present. No pharyngeal swelling, oropharyngeal exudate, posterior oropharyngeal erythema or uvula swelling.     Tonsils: No tonsillar exudate or tonsillar abscesses.  Eyes:     General:        Right eye: No  discharge.        Left eye: No discharge.     Conjunctiva/sclera: Conjunctivae normal.  Cardiovascular:     Rate and Rhythm: Normal rate and regular rhythm.     Heart sounds: Normal heart sounds.  Pulmonary:     Effort: Pulmonary effort is normal. No respiratory distress.     Breath sounds: Normal breath sounds. No wheezing, rhonchi or rales.  Abdominal:     Tenderness: There is no rebound.  Musculoskeletal:     Cervical back: Full passive range of motion without pain and neck supple. No rigidity.  Skin:    General: Skin is warm.     Findings: No rash.  Neurological:     General: No focal deficit present.     Mental Status: She is alert.     Gait: Gait is intact. Gait normal.  Psychiatric:        Mood and Affect: Mood normal.        Behavior: Behavior normal.      Results for orders placed or performed in visit on 05/23/24  POCT RAPID INFLUENZA MCKESSON  Component Result   Rapid Influenza A Ag Negative   Rapid Influenza B Ag Negative   Internal Quality Control Pass  [facility] Covid 19 Antigen POCT  Component Result   Rapid Covid Negative   Internal Quality Control Pass     Procedures MDM:     1 Acute, uncomplicated illness or injury     Explanation of Medical Decision Making and variances from expected care:    Based off the clinical presentation, history obtained, and duration of patient's persistent upper respiratory/sinus symptoms will treat patient with oral antibiotics to cover for what I suspect possible bacterial acute sinusitis.   Discussed acute sinusitis management protocol as listed in the discharge instructions.   Advised patient if no improvements in symptoms in the next 3-4 days while taking the antibiotic, patient is to seek further care and evaluation.      Unique ordered tests: Two     Review of any test results: Two     Assessment requiring historian other than patient: No     Discussion of management with another provider: No     Risk::  Moderate          Assessment/Plan Diagnoses and all orders for this visit:  Acute pansinusitis, recurrence not specified -     POCT RAPID INFLUENZA MCKESSON -     [facility] Covid 19 Antigen POCT -     amoxicillin -clavulanate (Augmentin ) 875-125 MG tablet; Take 1 tablet by mouth in the morning and 1 tablet in the evening. Do all this for 10 days.  Other orders -     fluconazole  (Diflucan ) 150 MG tablet;  Take 1 tablet by mouth on Day 1 and then may repeat on Day 3.      Disposition Status: Home  Progress note signed by Alm Prophet, PA on 05/23/24 at 10:17 AM

## 2024-07-03 ENCOUNTER — Emergency Department (HOSPITAL_COMMUNITY)
Admission: EM | Admit: 2024-07-03 | Discharge: 2024-07-03 | Disposition: A | Discharge status: Home / Self Care | Attending: Emergency Medicine | Admitting: Emergency Medicine

## 2024-07-03 ENCOUNTER — Emergency Department (HOSPITAL_COMMUNITY)

## 2024-07-03 ENCOUNTER — Other Ambulatory Visit: Payer: Self-pay

## 2024-07-03 ENCOUNTER — Encounter (HOSPITAL_COMMUNITY): Payer: Self-pay | Admitting: Pharmacy Technician

## 2024-07-03 DIAGNOSIS — M25561 Pain in right knee: Secondary | ICD-10-CM | POA: Diagnosis present

## 2024-07-03 MED ORDER — DICLOFENAC POTASSIUM 25 MG PO CAPS: 25.0000 mg | ORAL_CAPSULE | Freq: Three times a day (TID) | ORAL | 0 refills | Status: AC | PRN

## 2024-07-03 NOTE — ED Triage Notes (Signed)
 Pt with R knee pain since yesterday. Denies known injury. States she used to get injections in that knee.

## 2024-07-03 NOTE — ED Provider Notes (Signed)
  EMERGENCY DEPARTMENT AT [facility] Provider Note   CSN: [id number] Arrival date & time: 07/03/24  9141     Patient presents with: Knee Pain   Allison Klein is a 48 y.o. female.  Patient without significant acute history presents emergency department concerns of knee pain.  Reports that she has had issues with her right knee for several years.  Prior meniscal repair during her teenage years.  She states that she has had significantly worsening pain in the right knee since yesterday.  No reported injury, trauma, falls, or on twisting of the knee.  Reports some difficulty with ambulation due to pain.  Has tried Tylenol without improvement in symptoms.  Denies any IV drug use, prolonged steroid use, recent surgery.   Knee Pain      Prior to Admission medications   Medication Sig Start Date End Date Taking? Authorizing Provider  Diclofenac  Potassium 25 MG CAPS Take 1 capsule (25 mg total) by mouth 3 (three) times daily as needed for up to 10 days. 07/03/24 07/13/24 Yes Bryttany Tortorelli A, PA-C  benzonatate  (TESSALON ) 100 MG capsule Take 1 capsule (100 mg total) by mouth every 8 (eight) hours. 02/06/20   Rolinda Rogue, MD  fluticasone  (FLONASE ) 50 MCG/ACT nasal spray Place 2 sprays into both nostrils daily. 04/19/17 04/29/17  Leath-Warren, Etta PARAS, NP  montelukast  (SINGULAIR ) 10 MG tablet Take 1 tablet (10 mg total) by mouth at bedtime. 04/19/17 05/19/17  Leath-Warren, Etta PARAS, NP    Allergies: Patient has no known allergies.    Review of Systems  Musculoskeletal:  Positive for joint swelling.       Knee pain   All other systems reviewed and are negative.   Updated Vital Signs BP 128/76   Pulse 82   Temp 98 F (36.7 C) (Oral)   Resp 17   LMP 06/16/2024   SpO2 100%   Physical Exam Vitals and nursing note reviewed.  Constitutional:      General: She is not in acute distress.    Appearance: She is well-developed.  HENT:     Head: Normocephalic and  atraumatic.  Eyes:     Conjunctiva/sclera: Conjunctivae normal.  Cardiovascular:     Rate and Rhythm: Normal rate and regular rhythm.     Heart sounds: No murmur heard. Pulmonary:     Effort: Pulmonary effort is normal. No respiratory distress.     Breath sounds: Normal breath sounds.  Abdominal:     Palpations: Abdomen is soft.     Tenderness: There is no abdominal tenderness.  Musculoskeletal:        General: Swelling and tenderness present. No deformity or signs of injury.     Cervical back: Neck supple.     Comments: Swelling to the right knee compared to left. Not erythematous or warm to the touch. Significant crepitus felt with flexion and extension of the knee.  Skin:    General: Skin is warm and dry.     Capillary Refill: Capillary refill takes less than 2 seconds.  Neurological:     Mental Status: She is alert.  Psychiatric:        Mood and Affect: Mood normal.     (all labs ordered are listed, but only abnormal results are displayed) Labs Reviewed - No data to display  EKG: None  Radiology: DG Knee Complete 4 Views Right Result Date: 07/03/2024 CLINICAL DATA:  Pain EXAM: RIGHT KNEE - COMPLETE 4 VIEW COMPARISON:  06/29/2021 FINDINGS: No fracture  or dislocation. Mild joint space loss of the lateral compartment with some small osteophytes. Preserved bone mineralization. There is a moderate joint effusion on lateral view. IMPRESSION: Progressive degenerative changes of the lateral compartment. Moderate joint effusion. Effusion has a differential. Please correlate for any history of trauma or infection. Further workup when clinically appropriate Electronically Signed   By: Ranell Bring M.D.   On: 07/03/2024 09:26     Procedures   Medications Ordered in the ED - No data to display                                  Medical Decision Making Amount and/or Complexity of Data Reviewed Radiology: ordered.  Risk Prescription drug management.   This patient presents to  the ED for concern of knee pain.  Differential diagnosis includes osteoarthritis, joint effusion, septic arthritis, avulsion injury, tibial plateau fracture, ligamental strain    Imaging Studies ordered:  I ordered imaging studies including x-ray right knee I independently visualized and interpreted imaging which showed Progressive degenerative changes of the lateral compartment. Moderate joint effusion. Effusion has a differential. Please correlate for any history of trauma or infection. Further workup when clinically appropriate I agree with the radiologist interpretation   Problem List / ED Course:  Patient presented to the emergency department concerns of knee pain.  Reports she has had ongoing right knee pain for the last year has worsened considerably.  Has previously been seen by orthopedics and has tried a course of knee injections with steroids and most recent hyaluronic acid.  Reports that the hyaluronic acid injection was about 2 years ago.  Denies any recent injury or trauma in the last day.  No history of IV drug use.  Denies any recent steroid use whether p.o. or injected.  No recent fever, chills, body aches.  Denies any lacerations or lesions around the knee prior to the development of the swelling. On exam, patient does have a slightly more swollen right knee compared to left.  There is no focal erythema or warmth to the knee.  Mobility of the knee is limited due to pain but there is significant crepitus with flexion extension of the knee.  No obvious ligamental injury elicited.  Given lack of focal trauma, do not feel patient requires more evaluation beyond an x-ray that was obtained in triage. X-rays negative for any acute findings but there is a notable joint effusion present.  Doubtful of infectious etiology given lack of fever, chills, erythema, or induration of the area.  No recent surgery in this site to concern me for infected joint space from prosthesis.  Suspect likely  osteoarthritis.  Will discharge home with prescription for diclofenac .  Advised patient to follow-up with orthopedics if evaluation.  Return precautions discussed such as concerns for new or worsening.  Otherwise stable this time for outpatient follow-up and discharged home.  Final diagnoses:  Acute pain of right knee    ED Discharge Orders          Ordered    Diclofenac  Potassium 25 MG CAPS  3 times daily PRN        07/03/24 1145               Fong Mccarry A, PA-C 07/03/24 1731    Long, Joshua G, MD 07/05/24 [phone]

## 2024-07-03 NOTE — Discharge Instructions (Signed)
 You are seen in the emergency department today for concerns of knee pain.  Your x-ray does show that you have some level of fluid buildup in this area.  Given that you have no recent injury and no risk factors for infection, I suspect this is likely due to worsening arthritis.  Continue to use Tylenol for pain as needed.  I sent a prescription for diclofenac  for you to use for the next 5 days.  Please follow-up with orthopedic team for further evaluation.  Return to the emergency department for any concerns of new or worsening symptoms.

## 2025-01-23 ENCOUNTER — Emergency Department

## 2025-01-23 ENCOUNTER — Other Ambulatory Visit: Payer: Self-pay

## 2025-01-23 ENCOUNTER — Emergency Department
Admission: EM | Admit: 2025-01-23 | Discharge: 2025-01-23 | Disposition: A | Discharge status: Home / Self Care | Attending: Emergency Medicine | Admitting: Emergency Medicine

## 2025-01-23 DIAGNOSIS — R531 Weakness: Secondary | ICD-10-CM | POA: Insufficient documentation

## 2025-01-23 DIAGNOSIS — R519 Headache, unspecified: Secondary | ICD-10-CM | POA: Insufficient documentation

## 2025-01-23 DIAGNOSIS — R42 Dizziness and giddiness: Secondary | ICD-10-CM | POA: Insufficient documentation

## 2025-01-23 DIAGNOSIS — R202 Paresthesia of skin: Secondary | ICD-10-CM | POA: Insufficient documentation

## 2025-01-23 LAB — COMPREHENSIVE METABOLIC PANEL WITH GFR
ALT: 11 U/L (ref 0–44)
AST: 16 U/L (ref 15–41)
Albumin: 4.4 g/dL (ref 3.5–5.0)
Alkaline Phosphatase: 72 U/L (ref 38–126)
Anion gap: 13 (ref 5–15)
BUN: 12 mg/dL (ref 6–20)
CO2: 23 mmol/L (ref 22–32)
Calcium: 9.3 mg/dL (ref 8.9–10.3)
Chloride: 103 mmol/L (ref 98–111)
Creatinine, Ser: 0.62 mg/dL (ref 0.44–1.00)
GFR, Estimated: >60 mL/min
Glucose, Bld: 123 mg/dL — ABNORMAL HIGH (ref 70–99)
Potassium: 3.9 mmol/L (ref 3.5–5.1)
Sodium: 139 mmol/L (ref 135–145)
Total Bilirubin: 0.4 mg/dL (ref 0.0–1.2)
Total Protein: 8.1 g/dL (ref 6.5–8.1)

## 2025-01-23 LAB — APTT: aPTT: 25 s (ref 24–36)

## 2025-01-23 LAB — CBC WITH DIFFERENTIAL/PLATELET
Abs Immature Granulocytes: 0.02 10*3/uL (ref 0.00–0.07)
Basophils Absolute: 0 10*3/uL (ref 0.0–0.1)
Basophils Relative: 0 %
Eosinophils Absolute: 0 10*3/uL (ref 0.0–0.5)
Eosinophils Relative: 0 %
HCT: 41.1 % (ref 36.0–46.0)
Hemoglobin: 13.3 g/dL (ref 12.0–15.0)
Immature Granulocytes: 0 %
Lymphocytes Relative: 29 %
Lymphs Abs: 2.4 10*3/uL (ref 0.7–4.0)
MCH: 28.3 pg (ref 26.0–34.0)
MCHC: 32.4 g/dL (ref 30.0–36.0)
MCV: 87.4 fL (ref 80.0–100.0)
Monocytes Absolute: 0.6 10*3/uL (ref 0.1–1.0)
Monocytes Relative: 7 %
Neutro Abs: 5.2 10*3/uL (ref 1.7–7.7)
Neutrophils Relative %: 64 %
Platelets: 384 10*3/uL (ref 150–400)
RBC: 4.7 MIL/uL (ref 3.87–5.11)
RDW: 12.9 % (ref 11.5–15.5)
WBC: 8.2 10*3/uL (ref 4.0–10.5)
nRBC: 0 % (ref 0.0–0.2)

## 2025-01-23 LAB — PROTIME-INR
INR: 1 (ref 0.8–1.2)
Prothrombin Time: 14 s (ref 11.4–15.2)

## 2025-01-23 LAB — POC URINE PREG, ED: Preg Test, Ur: NEGATIVE

## 2025-01-23 LAB — ETHANOL: Alcohol, Ethyl (B): <15 mg/dL

## 2025-01-23 MED ORDER — DIPHENHYDRAMINE HCL 50 MG/ML IJ SOLN
25.0000 mg | Freq: Once | INTRAMUSCULAR | Status: AC
  Administered 2025-01-23: 25 mg via INTRAVENOUS
  Filled 2025-01-23: qty 1

## 2025-01-23 MED ORDER — LACTATED RINGERS IV BOLUS
1000.0000 mL | Freq: Once | INTRAVENOUS | Status: AC
  Administered 2025-01-23: 1000 mL via INTRAVENOUS

## 2025-01-23 MED ORDER — METOCLOPRAMIDE HCL 5 MG/ML IJ SOLN
10.0000 mg | Freq: Once | INTRAMUSCULAR | Status: AC
  Administered 2025-01-23: 10 mg via INTRAVENOUS
  Filled 2025-01-23: qty 2

## 2025-01-23 MED ORDER — IOHEXOL 350 MG/ML SOLN
75.0000 mL | Freq: Once | INTRAVENOUS | Status: AC | PRN
  Administered 2025-01-23: 75 mL via INTRAVENOUS

## 2025-01-23 MED ORDER — METOCLOPRAMIDE HCL 10 MG PO TABS: 10.0000 mg | ORAL_TABLET | Freq: Three times a day (TID) | ORAL | 0 refills | Status: AC | PRN

## 2025-01-23 MED ORDER — ACETAMINOPHEN 500 MG PO TABS
1000.0000 mg | ORAL_TABLET | Freq: Once | ORAL | Status: AC
  Administered 2025-01-23: 1000 mg via ORAL
  Filled 2025-01-23: qty 2

## 2025-01-23 MED ORDER — PROCHLORPERAZINE EDISYLATE 10 MG/2ML IJ SOLN
10.0000 mg | Freq: Once | INTRAMUSCULAR | Status: AC
  Administered 2025-01-23: 10 mg via INTRAVENOUS
  Filled 2025-01-23: qty 2

## 2025-01-23 NOTE — ED Triage Notes (Signed)
 Pt to ED for worst headache of my life started on Monday with right arm heaviness/weakness.

## 2025-01-23 NOTE — ED Provider Notes (Signed)
 Allison Klein Provider Note    Event Date/Time   First MD Initiated Contact with Patient 01/23/25 1753     (approximate)   History   Headache and Weakness   HPI  Allison Klein is a 49 y.o. female with history of bilateral keratoconus presenting with headache.  States this started Monday.  States she was working at her computer, felt an intense headache on her right, so it was sudden onset.  Felt some lightheadedness and blurry vision, felt that her left side of her body was tingling and heavy.  States that she took some ibuprofen  which helped slightly but has not completely gone away.  States that symptoms has been persistent.  Also noted some neck stiffness but states that she thought was due to her mattress.  Does have history of cluster headaches in the past but states that the character and intensity of this headache is different.  She denies any history of aneurysms or blood clots.  No recent travel or surgeries.  No fever.  No history of CVA or MS.  States no blurry vision or double vision at this time.  On independent chart review, she went to urgent care today who sent her in to the emergency department for further evaluation.     Physical Exam   Triage Vital Signs: ED Triage Vitals  Encounter Vitals Group     BP 01/23/25 1350 (!) 142/107     Girls Systolic BP Percentile --      Girls Diastolic BP Percentile --      Boys Systolic BP Percentile --      Boys Diastolic BP Percentile --      Pulse Rate 01/23/25 1350 100     Resp 01/23/25 1348 18     Temp 01/23/25 1348 98.5 F (36.9 C)     Temp src --      SpO2 01/23/25 1350 100 %     Weight 01/23/25 1350 190 lb (86.2 kg)     Height 01/23/25 1350 5' 3 (1.6 m)     Head Circumference --      Peak Flow --      Pain Score 01/23/25 1350 10     Pain Loc --      Pain Education --      Exclude from Growth Chart --     Most recent vital signs: Vitals:   01/23/25 1350 01/23/25 1836  BP: (!)  142/107   Pulse: 100   Resp:    Temp:  98.3 F (36.8 C)  SpO2: 100%      General: Awake, no distress.  CV:  Good peripheral perfusion.  Resp:  Normal effort.  No tachypnea or respiratory distress Abd:  No distention.  Soft nontender Other:  Patient is well-appearing, she has no nuchal rigidity or neck stiffness, no midline spinal tenderness, pupils equal and reactive, extraocular moods are intact, no visual field cuts, no other cranial nerve deficits, no focal weakness or numbness, she has equal radial pulses bilaterally.  She has no facial swelling, no periorbital swelling, no proptosis.   ED Results / Procedures / Treatments   Labs (all labs ordered are listed, but only abnormal results are displayed) Labs Reviewed  COMPREHENSIVE METABOLIC PANEL WITH GFR - Abnormal; Notable for the following components:      Result Value   Glucose, Bld 123 (*)    All other components within normal limits  PROTIME-INR  APTT  ETHANOL  CBC WITH  DIFFERENTIAL/PLATELET  POC URINE PREG, ED      RADIOLOGY On my independent interpretation, CT head without obvious intracranial hemorrhage   PROCEDURES:  Critical Care performed: No  Procedures   MEDICATIONS ORDERED IN ED: Medications  lactated ringers  bolus 1,000 mL (1,000 mLs Intravenous New Bag/Given 01/23/25 1855)  metoCLOPramide  (REGLAN ) injection 10 mg (10 mg Intravenous Given 01/23/25 1853)  acetaminophen  (TYLENOL ) tablet 1,000 mg (1,000 mg Oral Given 01/23/25 1853)  iohexol  (OMNIPAQUE ) 350 MG/ML injection 75 mL (75 mLs Intravenous Contrast Given 01/23/25 1906)  prochlorperazine  (COMPAZINE ) injection 10 mg (10 mg Intravenous Given 01/23/25 2111)  diphenhydrAMINE  (BENADRYL ) injection 25 mg (25 mg Intravenous Given 01/23/25 2111)  iohexol  (OMNIPAQUE ) 350 MG/ML injection 75 mL (75 mLs Intravenous Contrast Given 01/23/25 2214)     IMPRESSION / MDM / ASSESSMENT AND PLAN / ED COURSE  I reviewed the triage vital signs and the nursing notes.                               Differential diagnosis includes, but is not limited to, complex migraine, no focal weakness or deficits at this time to suggest CVA.  Did also consider cluster headache.  Did also consider subarachnoid hemorrhage or aneurysm given that she states that the headache is thunderclap and intense but has no actual deficits on exam, she is nontoxic-appearing, no nuchal rigidity.  Will get labs, CT, CT angio head and neck.  Migraine cocktail.  Patient's presentation is most consistent with acute presentation with potential threat to life or bodily function.  Independent interpretation of labs and imaging below.  Clinical course as below, multiple reassessments, she is well-appearing, no focal deficits on exam.  Did discuss with her about imaging and lab results, with this presentation, doubt subarachnoid hemorrhage, no focal deficits as a stroke, CT venogram is negative for sinus venous thrombosis, no aneurysms were also noted on CT.  Did discuss with her about outpatient follow-up with neurology for further management of her headaches.  Will give her a number to call to follow-up.  Will also give her prescription for Reglan  that she can take as needed for headaches/migraines.  Otherwise considered but no indication for inpatient admission at this time, she safe for outpatient management.  Will discharge with strict return precautions, also instructed to follow-up primary care this week or next week to get reassessed.  Shared decision making done with patient and she is agreeable with this plan.    Clinical Course as of 01/23/25 2237  Wed Jan 23, 2025  2003 CT Angio Head Neck W WO CM IMPRESSION: 1. No large vessel occlusion, hemodynamically significant stenosis, or aneurysm in the head or neck.     [TT]  2037 On reassessment patient states that the migraine cocktail is helping, headache is not completely gone, will give her a another round of migraine cocktail. [TT]  2204 Will also  get a CT venogram to make sure that this is not a sinus venous thrombosis. [TT]  2233 CT VENOGRAM HEAD IMPRESSION: 1. No acute intracranial abnormality. 2. No dural venous sinus thrombosis.   [TT]  2234 On reassessment headache has been improving since initial presentation.  CT imaging have been reassuring, no aneurysm or evidence of sinus venous thrombosis.  She patient is well-appearing, did discuss with her about imaging and lab results, we will give her a prescription for Reglan  that she can take outpatient for her headache.  Will also give her  number to call for neurology to schedule outpatient follow-up.  Otherwise considered but no indication for inpatient mission at this time, she safe for outpatient management. [TT]    Clinical Course User Index [TT] Waymond Lorelle Cummins, MD     FINAL CLINICAL IMPRESSION(S) / ED DIAGNOSES   Final diagnoses:  Nonintractable headache, unspecified chronicity pattern, unspecified headache type     Rx / DC Orders   ED Discharge Orders          Ordered    metoCLOPramide  (REGLAN ) 10 MG tablet  Every 8 hours PRN        01/23/25 2235             Note:  This document was prepared using Dragon voice recognition software and may include unintentional dictation errors.    Waymond Lorelle Cummins, MD 01/23/25 [phone]

## 2025-01-23 NOTE — Discharge Instructions (Signed)
 You can take Tylenol  every 6 hours as needed for the headache, he can also try the Reglan  as prescribed.  I put in number for you to call for the neurologist to schedule follow-up.  Otherwise please follow-up with your primary care doctor this week or next week to get reassessed.
